# Patient Record
Sex: Male | Born: 1985 | Race: White | Hispanic: No | Smoking: Current every day smoker
Health system: Southern US, Community
[De-identification: ages and names within clinical notes are randomized; demographics above are authoritative.]

## PROBLEM LIST (undated history)

## (undated) DIAGNOSIS — F319 Bipolar disorder, unspecified: Secondary | ICD-10-CM

## (undated) DIAGNOSIS — F4310 Post-traumatic stress disorder, unspecified: Secondary | ICD-10-CM

---

## 2016-04-09 ENCOUNTER — Emergency Department (HOSPITAL_BASED_OUTPATIENT_CLINIC_OR_DEPARTMENT_OTHER): Payer: Self-pay

## 2016-04-09 ENCOUNTER — Inpatient Hospital Stay (HOSPITAL_BASED_OUTPATIENT_CLINIC_OR_DEPARTMENT_OTHER)
Admission: EM | Admit: 2016-04-09 | Discharge: 2016-04-15 | DRG: 871 | Disposition: A | Discharge status: Home / Self Care | Payer: Self-pay | Attending: Internal Medicine | Admitting: Internal Medicine

## 2016-04-09 ENCOUNTER — Encounter (HOSPITAL_BASED_OUTPATIENT_CLINIC_OR_DEPARTMENT_OTHER): Payer: Self-pay | Admitting: *Deleted

## 2016-04-09 DIAGNOSIS — A400 Sepsis due to streptococcus, group A: Principal | ICD-10-CM | POA: Diagnosis present

## 2016-04-09 DIAGNOSIS — L03114 Cellulitis of left upper limb: Secondary | ICD-10-CM

## 2016-04-09 DIAGNOSIS — I82612 Acute embolism and thrombosis of superficial veins of left upper extremity: Secondary | ICD-10-CM | POA: Diagnosis present

## 2016-04-09 DIAGNOSIS — A419 Sepsis, unspecified organism: Secondary | ICD-10-CM | POA: Diagnosis present

## 2016-04-09 DIAGNOSIS — F17210 Nicotine dependence, cigarettes, uncomplicated: Secondary | ICD-10-CM | POA: Diagnosis present

## 2016-04-09 DIAGNOSIS — R9389 Abnormal findings on diagnostic imaging of other specified body structures: Secondary | ICD-10-CM

## 2016-04-09 DIAGNOSIS — I76 Septic arterial embolism: Secondary | ICD-10-CM | POA: Diagnosis present

## 2016-04-09 DIAGNOSIS — I2690 Septic pulmonary embolism without acute cor pulmonale: Secondary | ICD-10-CM

## 2016-04-09 DIAGNOSIS — I82409 Acute embolism and thrombosis of unspecified deep veins of unspecified lower extremity: Secondary | ICD-10-CM

## 2016-04-09 DIAGNOSIS — R509 Fever, unspecified: Secondary | ICD-10-CM

## 2016-04-09 DIAGNOSIS — F1123 Opioid dependence with withdrawal: Secondary | ICD-10-CM | POA: Diagnosis present

## 2016-04-09 DIAGNOSIS — R6510 Systemic inflammatory response syndrome (SIRS) of non-infectious origin without acute organ dysfunction: Secondary | ICD-10-CM

## 2016-04-09 DIAGNOSIS — F1110 Opioid abuse, uncomplicated: Secondary | ICD-10-CM

## 2016-04-09 DIAGNOSIS — E872 Acidosis: Secondary | ICD-10-CM | POA: Diagnosis present

## 2016-04-09 DIAGNOSIS — I82622 Acute embolism and thrombosis of deep veins of left upper extremity: Secondary | ICD-10-CM

## 2016-04-09 DIAGNOSIS — J154 Pneumonia due to other streptococci: Secondary | ICD-10-CM | POA: Diagnosis present

## 2016-04-09 DIAGNOSIS — E871 Hypo-osmolality and hyponatremia: Secondary | ICD-10-CM

## 2016-04-09 LAB — CBC WITH DIFFERENTIAL/PLATELET
BASOS ABS: 0 10*3/uL (ref 0.0–0.1)
Basophils Relative: 0 %
EOS PCT: 1 %
Eosinophils Absolute: 0.3 10*3/uL (ref 0.0–0.7)
HEMATOCRIT: 35.4 % — AB (ref 39.0–52.0)
HEMOGLOBIN: 12.4 g/dL — AB (ref 13.0–17.0)
Lymphocytes Relative: 6 %
Lymphs Abs: 1.5 10*3/uL (ref 0.7–4.0)
MCH: 30.2 pg (ref 26.0–34.0)
MCHC: 35 g/dL (ref 30.0–36.0)
MCV: 86.1 fL (ref 78.0–100.0)
MONO ABS: 1.8 10*3/uL — AB (ref 0.1–1.0)
MONOS PCT: 7 %
Neutro Abs: 21.8 10*3/uL — ABNORMAL HIGH (ref 1.7–7.7)
Neutrophils Relative %: 86 %
Platelets: 277 10*3/uL (ref 150–400)
RBC: 4.11 MIL/uL — AB (ref 4.22–5.81)
RDW: 14.3 % (ref 11.5–15.5)
WBC Morphology: INCREASED
WBC: 25.4 10*3/uL — AB (ref 4.0–10.5)

## 2016-04-09 LAB — RAPID HIV SCREEN (HIV 1/2 AB+AG)
HIV 1/2 ANTIBODIES: NONREACTIVE
HIV-1 P24 ANTIGEN - HIV24: NONREACTIVE

## 2016-04-09 LAB — BASIC METABOLIC PANEL
ANION GAP: 9 (ref 5–15)
BUN: 16 mg/dL (ref 6–20)
CALCIUM: 8.1 mg/dL — AB (ref 8.9–10.3)
CO2: 24 mmol/L (ref 22–32)
CREATININE: 0.78 mg/dL (ref 0.61–1.24)
Chloride: 89 mmol/L — ABNORMAL LOW (ref 101–111)
Glucose, Bld: 143 mg/dL — ABNORMAL HIGH (ref 65–99)
Potassium: 3.5 mmol/L (ref 3.5–5.1)
SODIUM: 122 mmol/L — AB (ref 135–145)

## 2016-04-09 LAB — I-STAT CG4 LACTIC ACID, ED: Lactic Acid, Venous: 2.27 mmol/L (ref 0.5–1.9)

## 2016-04-09 MED ORDER — IOPAMIDOL (ISOVUE-300) INJECTION 61%
100.0000 mL | Freq: Once | INTRAVENOUS | Status: AC | PRN
  Administered 2016-04-10: 100 mL via INTRAVENOUS

## 2016-04-09 MED ORDER — ACETAMINOPHEN 500 MG PO TABS
1000.0000 mg | ORAL_TABLET | Freq: Once | ORAL | Status: AC
  Administered 2016-04-09: 1000 mg via ORAL
  Filled 2016-04-09: qty 2

## 2016-04-09 MED ORDER — SODIUM CHLORIDE 0.9 % IV BOLUS (SEPSIS)
30.0000 mL/kg | Freq: Once | INTRAVENOUS | Status: AC
  Administered 2016-04-09: 2040 mL via INTRAVENOUS

## 2016-04-09 MED ORDER — PIPERACILLIN-TAZOBACTAM 3.375 G IVPB
INTRAVENOUS | Status: AC
  Filled 2016-04-09: qty 50

## 2016-04-09 MED ORDER — PIPERACILLIN-TAZOBACTAM 3.375 G IVPB 30 MIN
3.3750 g | Freq: Once | INTRAVENOUS | Status: AC
  Administered 2016-04-09: 3.375 g via INTRAVENOUS
  Filled 2016-04-09: qty 50

## 2016-04-09 MED ORDER — VANCOMYCIN HCL IN DEXTROSE 1-5 GM/200ML-% IV SOLN
1000.0000 mg | Freq: Once | INTRAVENOUS | Status: AC
  Administered 2016-04-09: 1000 mg via INTRAVENOUS

## 2016-04-09 MED ORDER — VANCOMYCIN HCL IN DEXTROSE 1-5 GM/200ML-% IV SOLN
INTRAVENOUS | Status: AC
  Filled 2016-04-09: qty 200

## 2016-04-09 NOTE — ED Triage Notes (Addendum)
Pt c/o left arm pain and swelling with multiple abscess x 3 days Pt admits to iv drug use , during triage pt admitted to having needles , pt placed needles in sharp box in triage , pt denies any drugs with him

## 2016-04-09 NOTE — ED Notes (Addendum)
3 unsuccessful IV attempts for 2nd IV

## 2016-04-09 NOTE — ED Provider Notes (Signed)
[facility] Provider Note: Douglas Dell, MD, FACEP  By signing my name below, I, Douglas Parrish, attest that this documentation has been prepared under the direction and in the presence of Douglas Libra, MD. Electronically Signed: Doreatha Parrish, ED Scribe. 04/09/16. 10:59 PM.    CSN: [id number] MRN: [id number] ARRIVAL: 04/09/16 at 2149 ROOM: [facility]12/[facility]12   CHIEF COMPLAINT  Abscess   HISTORY OF PRESENT ILLNESS  Douglas Parrish is a 31 y.o. 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 Pt reports associated numbness of the left fingers. Pt also reports soreness with wrist movement and with touch. He was not aware of fevers at home, but had a temp of 103.2 in triage. No alleviating factors noted. 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 No h/o similar symptoms. He denies nausea, vomiting, diarrhea, trouble breathing, chills or additional similar areas elsewhere on the body.    History reviewed. No pertinent past medical history.  History reviewed. No pertinent surgical history.  History reviewed. No pertinent family history.  Social History  Substance Use Topics  . Smoking status: Current Every Day Smoker    Packs/day: 0.50    Types: Cigarettes  . Smokeless tobacco: Not on file  . Alcohol use No    Prior to Admission medications   Not on File    Allergies Patient has no known allergies.   REVIEW OF SYSTEMS  Negative except as noted here or in the History of Present Illness.   PHYSICAL EXAMINATION  Initial Vital Signs Blood pressure 121/71, pulse (!) 142, temperature (!) 103.2 F (39.6 C), resp. rate 16, height 5\' 8"  (1.727 m), weight 150 lb (68 kg), SpO2 96 %.  Examination General: Well-developed, well-nourished male in no acute distress; appearance consistent with age of record HENT: normocephalic; atraumatic Eyes:  pupils equal, round and reactive to light; extraocular muscles intact Neck: supple Heart: Regular rate and rhythm; tachycardia Lungs: clear to auscultation bilaterally Abdomen: soft; nondistended; nontender; no masses or hepatosplenomegaly; bowel sounds present Extremities: No deformity; full range of motion; pulses normal Neurologic: Awake, alert and oriented; motor function intact in all extremities and symmetric; no facial droop; decreased sensation of the fingers of the left hand  Skin: Warm and dry; erythema, edema, warmth and tenderness of left forearm, hand and upper arm without focal fluctuance (see photos)  Psychiatric: Normal mood and affect        RESULTS  Summary of this visit's results, reviewed by myself:   EKG Interpretation  Date/Time:  Thursday April 09 2016 22:50:04 EST Ventricular Rate:  135 PR Interval:    QRS Duration: 91 QT Interval:  278 QTC Calculation: 417 R Axis:   79 Text Interpretation:  Sinus tachycardia Probable left atrial enlargement ST elev, probable normal early repol pattern No previous ECGs available Confirmed by Cecile Guevara  MD, Douglas Parrish ([id number]) on 04/09/2016 11:06:24 PM      Laboratory Studies: Results for orders placed or performed during the hospital encounter of 04/09/16 (from the past 24 hour(s))  CBC with Differential/Platelet     Status: Abnormal   Collection Time: 04/09/16 10:20 PM  Result Value Ref Range   WBC 25.4 (H) 4.0 - 10.5 K/uL   RBC 4.11 (L) 4.22 - 5.81 MIL/uL   Hemoglobin 12.4 (L) 13.0 - 17.0 g/dL   HCT 62.1 (L) 30.8 - 65.7 %   MCV 86.1  78.0 - 100.0 fL   MCH 30.2 26.0 - 34.0 pg   MCHC 35.0 30.0 - 36.0 g/dL   RDW 16.1 09.6 - 04.5 %   Platelets 277 150 - 400 K/uL   Neutrophils Relative % 86 %   Lymphocytes Relative 6 %   Monocytes Relative 7 %   Eosinophils Relative 1 %   Basophils Relative 0 %   Neutro Abs 21.8 (H) 1.7 - 7.7 K/uL   Lymphs Abs 1.5 0.7 - 4.0 K/uL   Monocytes Absolute 1.8 (H) 0.1 - 1.0 K/uL   Eosinophils  Absolute 0.3 0.0 - 0.7 K/uL   Basophils Absolute 0.0 0.0 - 0.1 K/uL   RBC Morphology POLYCHROMASIA PRESENT    WBC Morphology INCREASED BANDS (>20% BANDS)   Basic metabolic panel     Status: Abnormal   Collection Time: 04/09/16 10:20 PM  Result Value Ref Range   Sodium 122 (L) 135 - 145 mmol/L   Potassium 3.5 3.5 - 5.1 mmol/L   Chloride 89 (L) 101 - 111 mmol/L   CO2 24 22 - 32 mmol/L   Glucose, Bld 143 (H) 65 - 99 mg/dL   BUN 16 6 - 20 mg/dL   Creatinine, Ser 4.09 0.61 - 1.24 mg/dL   Calcium 8.1 (L) 8.9 - 10.3 mg/dL   GFR calc non Af Amer >60 >60 mL/min   GFR calc Af Amer >60 >60 mL/min   Anion gap 9 5 - 15  Rapid HIV screen (HIV 1/2 Ab+Ag)     Status: None   Collection Time: 04/09/16 10:20 PM  Result Value Ref Range   HIV-1 P24 Antigen - HIV24 NON REACTIVE NON REACTIVE   HIV 1/2 Antibodies NON REACTIVE NON REACTIVE   Interpretation (HIV Ag Ab)      A non reactive test result means that HIV 1 or HIV 2 antibodies and HIV 1 p24 antigen were not detected in the specimen.  Comprehensive metabolic panel     Status: Abnormal   Collection Time: 04/09/16 10:20 PM  Result Value Ref Range   Sodium 122 (L) 135 - 145 mmol/L   Potassium 3.6 3.5 - 5.1 mmol/L   Chloride 90 (L) 101 - 111 mmol/L   CO2 23 22 - 32 mmol/L   Glucose, Bld 143 (H) 65 - 99 mg/dL   BUN 16 6 - 20 mg/dL   Creatinine, Ser 8.11 0.61 - 1.24 mg/dL   Calcium 8.2 (L) 8.9 - 10.3 mg/dL   Total Protein 6.5 6.5 - 8.1 g/dL   Albumin 2.7 (L) 3.5 - 5.0 g/dL   AST 70 (H) 15 - 41 U/L   ALT 40 17 - 63 U/L   Alkaline Phosphatase 73 38 - 126 U/L   Total Bilirubin 0.9 0.3 - 1.2 mg/dL   GFR calc non Af Amer >60 >60 mL/min   GFR calc Af Amer >60 >60 mL/min   Anion gap 9 5 - 15  I-Stat CG4 Lactic Acid, ED     Status: Abnormal   Collection Time: 04/09/16 10:37 PM  Result Value Ref Range   Lactic Acid, Venous 2.27 (HH) 0.5 - 1.9 mmol/L   Comment NOTIFIED PHYSICIAN   Urinalysis, Routine w reflex microscopic     Status: Abnormal    Collection Time: 04/10/16 12:01 AM  Result Value Ref Range   Color, Urine AMBER (A) YELLOW   APPearance CLOUDY (A) CLEAR   Specific Gravity, Urine 1.016 1.005 - 1.030   pH 6.0 5.0 - 8.0   Glucose,  UA NEGATIVE NEGATIVE mg/dL   Hgb urine dipstick LARGE (A) NEGATIVE   Bilirubin Urine NEGATIVE NEGATIVE   Ketones, ur NEGATIVE NEGATIVE mg/dL   Protein, ur 161 (A) NEGATIVE mg/dL   Nitrite NEGATIVE NEGATIVE   Leukocytes, UA NEGATIVE NEGATIVE  Urinalysis, Microscopic (reflex)     Status: Abnormal   Collection Time: 04/10/16 12:01 AM  Result Value Ref Range   RBC / HPF 0-5 0 - 5 RBC/hpf   WBC, UA 0-5 0 - 5 WBC/hpf   Bacteria, UA FEW (A) NONE SEEN   Squamous Epithelial / LPF 0-5 (A) NONE SEEN   Granular Casts, UA PRESENT   I-Stat CG4 Lactic Acid, ED  (not at  [facility])     Status: None   Collection Time: 04/10/16  2:06 AM  Result Value Ref Range   Lactic Acid, Venous 1.07 0.5 - 1.9 mmol/L   Imaging Studies: Dg Chest 2 View  Result Date: 04/10/2016 CLINICAL DATA:  31 y/o  M; chest complaints and leukocytosis. EXAM: CHEST  2 VIEW COMPARISON:  None. FINDINGS: Normal heart size. Prominent pulmonary markings, likely bronchitic changes. Opacity within the posterior costal diaphragmatic sulcus and in the retrosternal space on the lateral view. No pleural effusion or pneumothorax. Bones are unremarkable. IMPRESSION: Prominent pulmonary markings, likely bronchitic changes. Opacities within the retrosternal space and posterior costodiaphragmatic angle on the lateral view are suspicious for areas of pneumonia. The retrosternal focus is centrally lucent and may represent a pulmonary abscess/septic embolus. These results were called by telephone at the time of interpretation on 04/10/2016 at 2:24 am to Dr. Paula Parrish , who verbally acknowledged these results. Electronically Signed   By: Mitzi Hansen M.D.   On: 04/10/2016 02:21   Ct Extrem Up Entire Arm L W/cm  Result Date: 04/10/2016 CLINICAL DATA:   Left upper extremity cellulitis. IV drug use with redness, pain and swelling for several days. Elevated white blood cell count. Question abscess. EXAM: CT OF THE UPPER LEFT EXTREMITY WITH CONTRAST TECHNIQUE: Multidetector CT imaging of the upper left extremity was performed according to the standard protocol following intravenous contrast administration. COMPARISON:  None. CONTRAST:  ISOVUE-300 IOPAMIDOL (ISOVUE-300) INJECTION 61% FINDINGS: Bones/Joint/Cartilage No fracture, periosteal reaction or bony destructive change. No evidence of elbow or shoulder joint effusion. There is a bone island in the scapula. Ligaments Suboptimally assessed by CT. Muscles and Tendons No evidence of intramuscular fluid collection. Edema suspected in the lateral biceps musculature. Soft tissues Diffuse subcutaneous edema and skin thickening of the left upper extremity from the mid humerus distally. This appears most prominent about the distal humerus, elbow and forearm. Tiny skin defect about the lateral aspect of the mid forearm. No tracking soft tissue air. No peripherally enhancing collection to suggest abscess. Enlarged epitrochlear and axillary nodes are likely reactive. There is occlusive thrombosis throughout the basilic vein in the forearm and upper arm. Thrombus is also suspected of the cephalic vein in the forearm. Incidentally imaged paranasal sinus inflammation in the maxillary sinuses. IMPRESSION: 1. Diffuse cellulitis of the upper extremity without evidence of focal abscess. Tiny skin defect about the mid forearm, no tracking soft tissue air. 2. Edema within biceps musculature without gross intramuscular collection. 3. Thrombosis of the basilic and cephalic veins. 4. Epitrochlear and axillary lymph nodes are likely reactive. Electronically Signed   By: Rubye Oaks M.D.   On: 04/10/2016 01:59    ED COURSE  Nursing notes and initial vitals signs, including pulse oximetry, reviewed.  Vitals:   04/10/16  0030  04/10/16 0100 04/10/16 0224 04/10/16 0230  BP: 104/76 99/76 115/70 113/82  Pulse: 115 116 119 (!) 125  Resp: 19 17 (!) 28 26  Temp:   102.3 F (39.1 C)   TempSrc:   Oral   SpO2: 99% 95% 99% 96%  Weight:      Height:       Sepsis protocol initiated on initial evaluation. Zosyn and vancomycin given IV after cultures obtained.  2:29 AM Sepsis - Repeat Assessment  Vitals     Blood pressure 115/70, pulse 119, temperature 102.3 F (39.1 C), temperature source Oral, resp. rate (!) 28, height 5\' 8"  (1.727 m), weight 150 lb (68 kg), SpO2 99 %.  Heart:     Regular rate and rhythm and Tachycardic  Lungs:    CTA  Capillary Refill:   <2 sec  Peripheral Pulse:   Radial, dorsalis pedis and posterior tibial pulses palpable  Skin:     Normal Color     PROCEDURES   CRITICAL CARE Performed by: Taralee Marcus L Total critical care time: 45 minutes Critical care time was exclusive of separately billable procedures and treating other patients. Critical care was necessary to treat or prevent imminent or life-threatening deterioration. Critical care was time spent personally by me on the following activities: development of treatment plan with patient and/or surrogate as well as nursing, discussions with consultants, evaluation of patient's response to treatment, examination of patient, obtaining history from patient or surrogate, ordering and performing treatments and interventions, ordering and review of laboratory studies, ordering and review of radiographic studies, pulse oximetry and re-evaluation of patient's condition.   ED DIAGNOSES     ICD-9-CM ICD-10-CM   1. SIRS (systemic inflammatory response syndrome) (HCC) 995.90 R65.10   2. Cellulitis of left upper extremity 682.3 L03.114 CT Extrem Up Entire Arm L W/CM     CT Extrem Up Entire Arm L W/CM  3. Heroin abuse 305.50 F11.10   4. Acute deep vein thrombosis (DVT) of other vein of left upper extremity (HCC) 453.82 I82.622   5. Acute  septic pulmonary embolism without acute cor pulmonale (HCC) 415.12 I26.90   6. Hyponatremia 276.1 E87.1      I personally performed the services described in this documentation, which was scribed in my presence. The recorded information has been reviewed and is accurate.    Douglas LibraJohn Sincerity Cedar, MD 04/10/16 [id number]

## 2016-04-10 ENCOUNTER — Encounter (HOSPITAL_COMMUNITY): Payer: Self-pay | Admitting: Internal Medicine

## 2016-04-10 ENCOUNTER — Inpatient Hospital Stay (HOSPITAL_COMMUNITY): Payer: Self-pay

## 2016-04-10 ENCOUNTER — Emergency Department (HOSPITAL_BASED_OUTPATIENT_CLINIC_OR_DEPARTMENT_OTHER): Payer: Self-pay

## 2016-04-10 DIAGNOSIS — F1110 Opioid abuse, uncomplicated: Secondary | ICD-10-CM

## 2016-04-10 DIAGNOSIS — E871 Hypo-osmolality and hyponatremia: Secondary | ICD-10-CM

## 2016-04-10 DIAGNOSIS — A419 Sepsis, unspecified organism: Secondary | ICD-10-CM

## 2016-04-10 DIAGNOSIS — R9389 Abnormal findings on diagnostic imaging of other specified body structures: Secondary | ICD-10-CM

## 2016-04-10 DIAGNOSIS — L03114 Cellulitis of left upper limb: Secondary | ICD-10-CM

## 2016-04-10 DIAGNOSIS — R938 Abnormal findings on diagnostic imaging of other specified body structures: Secondary | ICD-10-CM

## 2016-04-10 DIAGNOSIS — I82622 Acute embolism and thrombosis of deep veins of left upper extremity: Secondary | ICD-10-CM

## 2016-04-10 DIAGNOSIS — R7881 Bacteremia: Secondary | ICD-10-CM

## 2016-04-10 DIAGNOSIS — M7989 Other specified soft tissue disorders: Secondary | ICD-10-CM

## 2016-04-10 DIAGNOSIS — I2690 Septic pulmonary embolism without acute cor pulmonale: Secondary | ICD-10-CM

## 2016-04-10 DIAGNOSIS — M79609 Pain in unspecified limb: Secondary | ICD-10-CM

## 2016-04-10 DIAGNOSIS — I82409 Acute embolism and thrombosis of unspecified deep veins of unspecified lower extremity: Secondary | ICD-10-CM

## 2016-04-10 LAB — PROTIME-INR
INR: 1.24
PROTHROMBIN TIME: 15.7 s — AB (ref 11.4–15.2)

## 2016-04-10 LAB — URINALYSIS, ROUTINE W REFLEX MICROSCOPIC
Bilirubin Urine: NEGATIVE
GLUCOSE, UA: NEGATIVE mg/dL
Ketones, ur: NEGATIVE mg/dL
LEUKOCYTES UA: NEGATIVE
NITRITE: NEGATIVE
PROTEIN: 100 mg/dL — AB
Specific Gravity, Urine: 1.016 (ref 1.005–1.030)
pH: 6 (ref 5.0–8.0)

## 2016-04-10 LAB — COMPREHENSIVE METABOLIC PANEL
ALBUMIN: 2.2 g/dL — AB (ref 3.5–5.0)
ALT: 40 U/L (ref 17–63)
ALT: 32 U/L (ref 17–63)
ANION GAP: 9 (ref 5–15)
ANION GAP: 7 (ref 5–15)
AST: 70 U/L — ABNORMAL HIGH (ref 15–41)
AST: 47 U/L — ABNORMAL HIGH (ref 15–41)
Albumin: 2.7 g/dL — ABNORMAL LOW (ref 3.5–5.0)
Alkaline Phosphatase: 73 U/L (ref 38–126)
Alkaline Phosphatase: 91 U/L (ref 38–126)
BUN: 16 mg/dL (ref 6–20)
BUN: 10 mg/dL (ref 6–20)
CHLORIDE: 90 mmol/L — AB (ref 101–111)
CHLORIDE: 101 mmol/L (ref 101–111)
CO2: 23 mmol/L (ref 22–32)
CO2: 29 mmol/L (ref 22–32)
CREATININE: 0.84 mg/dL (ref 0.61–1.24)
Calcium: 8.2 mg/dL — ABNORMAL LOW (ref 8.9–10.3)
Calcium: 7 mg/dL — ABNORMAL LOW (ref 8.9–10.3)
Creatinine, Ser: 0.7 mg/dL (ref 0.61–1.24)
GFR calc non Af Amer: >60 mL/min (ref >60)
GLUCOSE: 119 mg/dL — AB (ref 65–99)
Glucose, Bld: 143 mg/dL — ABNORMAL HIGH (ref 65–99)
Potassium: 3.6 mmol/L (ref 3.5–5.1)
Potassium: 3.5 mmol/L (ref 3.5–5.1)
SODIUM: 122 mmol/L — AB (ref 135–145)
SODIUM: 137 mmol/L (ref 135–145)
Total Bilirubin: 0.9 mg/dL (ref 0.3–1.2)
Total Bilirubin: 1.1 mg/dL (ref 0.3–1.2)
Total Protein: 6.5 g/dL (ref 6.5–8.1)
Total Protein: 7.3 g/dL (ref 6.5–8.1)

## 2016-04-10 LAB — CBC WITH DIFFERENTIAL/PLATELET
BASOS PCT: 0 %
Basophils Absolute: 0 10*3/uL (ref 0.0–0.1)
EOS ABS: 0 10*3/uL (ref 0.0–0.7)
EOS PCT: 0 %
HCT: 32.8 % — ABNORMAL LOW (ref 39.0–52.0)
Hemoglobin: 11 g/dL — ABNORMAL LOW (ref 13.0–17.0)
LYMPHS ABS: 1.3 10*3/uL (ref 0.7–4.0)
Lymphocytes Relative: 7 %
MCH: 29.1 pg (ref 26.0–34.0)
MCHC: 33.5 g/dL (ref 30.0–36.0)
MCV: 86.8 fL (ref 78.0–100.0)
Monocytes Absolute: 1.5 10*3/uL — ABNORMAL HIGH (ref 0.1–1.0)
Monocytes Relative: 8 %
NEUTROS PCT: 85 %
Neutro Abs: 16.3 10*3/uL — ABNORMAL HIGH (ref 1.7–7.7)
PLATELETS: 215 10*3/uL (ref 150–400)
RBC: 3.78 MIL/uL — AB (ref 4.22–5.81)
RDW: 15.1 % (ref 11.5–15.5)
WBC: 19.2 10*3/uL — AB (ref 4.0–10.5)

## 2016-04-10 LAB — BLOOD CULTURE ID PANEL (REFLEXED)
Acinetobacter baumannii: NOT DETECTED
CANDIDA GLABRATA: NOT DETECTED
CANDIDA KRUSEI: NOT DETECTED
Candida albicans: NOT DETECTED
Candida parapsilosis: NOT DETECTED
Candida tropicalis: NOT DETECTED
ENTEROBACTER CLOACAE COMPLEX: NOT DETECTED
ENTEROCOCCUS SPECIES: NOT DETECTED
ESCHERICHIA COLI: NOT DETECTED
Enterobacteriaceae species: NOT DETECTED
Haemophilus influenzae: NOT DETECTED
Klebsiella oxytoca: NOT DETECTED
Klebsiella pneumoniae: NOT DETECTED
LISTERIA MONOCYTOGENES: NOT DETECTED
NEISSERIA MENINGITIDIS: NOT DETECTED
PROTEUS SPECIES: NOT DETECTED
PSEUDOMONAS AERUGINOSA: NOT DETECTED
SERRATIA MARCESCENS: NOT DETECTED
STAPHYLOCOCCUS AUREUS BCID: NOT DETECTED
STAPHYLOCOCCUS SPECIES: NOT DETECTED
STREPTOCOCCUS AGALACTIAE: NOT DETECTED
STREPTOCOCCUS PNEUMONIAE: NOT DETECTED
STREPTOCOCCUS PYOGENES: DETECTED — AB
Streptococcus species: DETECTED — AB

## 2016-04-10 LAB — BASIC METABOLIC PANEL
Anion gap: 8 (ref 5–15)
BUN: 12 mg/dL (ref 6–20)
CO2: 23 mmol/L (ref 22–32)
CREATININE: 0.73 mg/dL (ref 0.61–1.24)
Calcium: 7.4 mg/dL — ABNORMAL LOW (ref 8.9–10.3)
Chloride: 98 mmol/L — ABNORMAL LOW (ref 101–111)
GFR calc Af Amer: >60 mL/min (ref >60)
GFR calc non Af Amer: >60 mL/min (ref >60)
GLUCOSE: 122 mg/dL — AB (ref 65–99)
POTASSIUM: 3.3 mmol/L — AB (ref 3.5–5.1)
SODIUM: 129 mmol/L — AB (ref 135–145)

## 2016-04-10 LAB — URINALYSIS, MICROSCOPIC (REFLEX)

## 2016-04-10 LAB — LACTIC ACID, PLASMA
LACTIC ACID, VENOUS: 1 mmol/L (ref 0.5–1.9)
Lactic Acid, Venous: 1.6 mmol/L (ref 0.5–1.9)

## 2016-04-10 LAB — I-STAT CG4 LACTIC ACID, ED: LACTIC ACID, VENOUS: 1.07 mmol/L (ref 0.5–1.9)

## 2016-04-10 LAB — ECHOCARDIOGRAM COMPLETE
Height: 69 in
WEIGHTICAEL: 2490.32 [oz_av]

## 2016-04-10 LAB — APTT: APTT: 40 s — AB (ref 24–36)

## 2016-04-10 LAB — PROCALCITONIN: PROCALCITONIN: 3.99 ng/mL

## 2016-04-10 LAB — LIPASE, BLOOD: LIPASE: 15 U/L (ref 11–51)

## 2016-04-10 LAB — MRSA PCR SCREENING: MRSA BY PCR: NEGATIVE

## 2016-04-10 MED ORDER — ONDANSETRON HCL 4 MG/2ML IJ SOLN: 4.0000 mg | Freq: Three times a day (TID) | INTRAMUSCULAR | Status: DC | PRN

## 2016-04-10 MED ORDER — IBUPROFEN 800 MG PO TABS
800.0000 mg | ORAL_TABLET | Freq: Once | ORAL | Status: AC
  Administered 2016-04-10: 800 mg via ORAL
  Filled 2016-04-10: qty 1

## 2016-04-10 MED ORDER — IOPAMIDOL (ISOVUE-300) INJECTION 61%
INTRAVENOUS | Status: AC
  Administered 2016-04-10: 75 mL via INTRAVENOUS
  Filled 2016-04-10: qty 75

## 2016-04-10 MED ORDER — PIPERACILLIN-TAZOBACTAM 3.375 G IVPB
3.3750 g | Freq: Three times a day (TID) | INTRAVENOUS | Status: DC
  Administered 2016-04-10 (×2): 3.375 g via INTRAVENOUS
  Filled 2016-04-10 (×5): qty 50

## 2016-04-10 MED ORDER — CLINDAMYCIN PHOSPHATE 600 MG/50ML IV SOLN
600.0000 mg | Freq: Three times a day (TID) | INTRAVENOUS | Status: DC
  Administered 2016-04-10 – 2016-04-13 (×8): 600 mg via INTRAVENOUS
  Filled 2016-04-10 (×9): qty 50

## 2016-04-10 MED ORDER — SODIUM CHLORIDE 0.9 % IV SOLN
INTRAVENOUS | Status: DC
  Administered 2016-04-10 – 2016-04-14 (×4): via INTRAVENOUS

## 2016-04-10 MED ORDER — ONDANSETRON HCL 4 MG PO TABS: 4.0000 mg | ORAL_TABLET | Freq: Four times a day (QID) | ORAL | Status: DC | PRN

## 2016-04-10 MED ORDER — ENOXAPARIN SODIUM 80 MG/0.8ML ~~LOC~~ SOLN
1.0000 mg/kg | Freq: Once | SUBCUTANEOUS | Status: AC
  Administered 2016-04-10: 70 mg via SUBCUTANEOUS
  Filled 2016-04-10: qty 0.8

## 2016-04-10 MED ORDER — MORPHINE SULFATE (PF) 2 MG/ML IV SOLN: 2.0000 mg | INTRAVENOUS | Status: DC | PRN

## 2016-04-10 MED ORDER — SODIUM CHLORIDE 0.9 % IV SOLN
INTRAVENOUS | Status: AC
  Administered 2016-04-10: 03:00:00 via INTRAVENOUS

## 2016-04-10 MED ORDER — ENOXAPARIN SODIUM 80 MG/0.8ML ~~LOC~~ SOLN
70.0000 mg | Freq: Two times a day (BID) | SUBCUTANEOUS | Status: DC
  Administered 2016-04-10: 70 mg via SUBCUTANEOUS
  Filled 2016-04-10: qty 0.8

## 2016-04-10 MED ORDER — PIPERACILLIN-TAZOBACTAM 3.375 G IVPB 30 MIN: 3.3750 g | Freq: Once | INTRAVENOUS | Status: DC

## 2016-04-10 MED ORDER — VANCOMYCIN HCL IN DEXTROSE 1-5 GM/200ML-% IV SOLN: 1000.0000 mg | Freq: Once | INTRAVENOUS | Status: DC

## 2016-04-10 MED ORDER — VANCOMYCIN HCL 10 G IV SOLR
1250.0000 mg | Freq: Two times a day (BID) | INTRAVENOUS | Status: DC
Start: 2016-04-10 — End: 2016-04-10
  Administered 2016-04-10: 1250 mg via INTRAVENOUS
  Filled 2016-04-10 (×3): qty 1250

## 2016-04-10 MED ORDER — SODIUM CHLORIDE 0.9 % IV BOLUS (SEPSIS)
1000.0000 mL | Freq: Once | INTRAVENOUS | Status: AC
  Administered 2016-04-10: 1000 mL via INTRAVENOUS

## 2016-04-10 MED ORDER — ACETAMINOPHEN 325 MG PO TABS
650.0000 mg | ORAL_TABLET | Freq: Four times a day (QID) | ORAL | Status: DC | PRN
  Administered 2016-04-10: 650 mg via ORAL
  Filled 2016-04-10: qty 2

## 2016-04-10 MED ORDER — PENICILLIN G POTASSIUM 5000000 UNITS IJ SOLR
4.0000 10*6.[IU] | INTRAVENOUS | Status: DC
  Administered 2016-04-10 – 2016-04-15 (×26): 4 10*6.[IU] via INTRAVENOUS
  Filled 2016-04-10 (×32): qty 4

## 2016-04-10 MED ORDER — ENOXAPARIN SODIUM 40 MG/0.4ML ~~LOC~~ SOLN
40.0000 mg | SUBCUTANEOUS | Status: DC
  Administered 2016-04-11 – 2016-04-15 (×4): 40 mg via SUBCUTANEOUS
  Filled 2016-04-10 (×5): qty 0.4

## 2016-04-10 MED ORDER — ACETAMINOPHEN 650 MG RE SUPP: 650.0000 mg | Freq: Four times a day (QID) | RECTAL | Status: DC | PRN

## 2016-04-10 MED ORDER — ONDANSETRON HCL 4 MG/2ML IJ SOLN: 4.0000 mg | Freq: Four times a day (QID) | INTRAMUSCULAR | Status: DC | PRN

## 2016-04-10 NOTE — Progress Notes (Signed)
Pharmacy Antibiotic Note  Douglas Parrish is a 31 y.o. male admitted on 04/09/2016 with cellulitis/septic emboli.  Pharmacy has been consulted for Vancomycin and Zosyn  Dosing.  Vancomycin 1 g IV given in ED at 2330   Plan: Vancomycin 1250 mg IV q12h Zosyn 3.375 g IV q8h   Height: 5\' 9"  (175.3 cm) Weight: 155 lb 10.3 oz (70.6 kg) IBW/kg (Calculated) : 70.7  Temp (24hrs), Avg:100.7 F (38.2 C), Min:98.1 F (36.7 C), Max:103.2 F (39.6 C)   Recent Labs Lab 04/09/16 2220 04/09/16 2237 04/10/16 0206 04/10/16 0310  WBC 25.4*  --   --   --   CREATININE 0.84  0.78  --   --  0.73  LATICACIDVEN  --  2.27* 1.07  --     Estimated Creatinine Clearance: 137.3 mL/min (by C-G formula based on SCr of 0.73 mg/dL).    No Known Allergies   Douglas Parrish, Douglas Parrish

## 2016-04-10 NOTE — Progress Notes (Signed)
PHARMACY - PHYSICIAN COMMUNICATION CRITICAL VALUE ALERT - BLOOD CULTURE IDENTIFICATION (BCID)  Results for orders placed or performed during the hospital encounter of 04/09/16  Blood Culture ID Panel (Reflexed) (Collected: 04/09/2016 10:20 PM)  Result Value Ref Range   Enterococcus species NOT DETECTED NOT DETECTED   Listeria monocytogenes NOT DETECTED NOT DETECTED   Staphylococcus species NOT DETECTED NOT DETECTED   Staphylococcus aureus NOT DETECTED NOT DETECTED   Streptococcus species DETECTED (A) NOT DETECTED   Streptococcus agalactiae NOT DETECTED NOT DETECTED   Streptococcus pneumoniae NOT DETECTED NOT DETECTED   Streptococcus pyogenes DETECTED (A) NOT DETECTED   Acinetobacter baumannii NOT DETECTED NOT DETECTED   Enterobacteriaceae species NOT DETECTED NOT DETECTED   Enterobacter cloacae complex NOT DETECTED NOT DETECTED   Escherichia coli NOT DETECTED NOT DETECTED   Klebsiella oxytoca NOT DETECTED NOT DETECTED   Klebsiella pneumoniae NOT DETECTED NOT DETECTED   Proteus species NOT DETECTED NOT DETECTED   Serratia marcescens NOT DETECTED NOT DETECTED   Haemophilus influenzae NOT DETECTED NOT DETECTED   Neisseria meningitidis NOT DETECTED NOT DETECTED   Pseudomonas aeruginosa NOT DETECTED NOT DETECTED   Candida albicans NOT DETECTED NOT DETECTED   Candida glabrata NOT DETECTED NOT DETECTED   Candida krusei NOT DETECTED NOT DETECTED   Candida parapsilosis NOT DETECTED NOT DETECTED   Candida tropicalis NOT DETECTED NOT DETECTED    Name of physician (or Provider) Contacted: M. Lynch, NP Changes to prescribed antibiotics required:  Discontinue Vancomycin and Zosyn. Start Penicillin G 4 MU IV q4h and Clindamycin 600 mg IV q8h.   Thank you for allowing pharmacy to be part of this patients care team. Douglas Parrish, RPh Clinical Pharmacist Pager: [phone] 04/10/2016  8:52 PM

## 2016-04-10 NOTE — H&P (Signed)
History and Physical    Douglas Parrish ZOX:[id number] DOB: 03-10-1986 DOA: 04/09/2016  PCP: No PCP Per Patient  Patient coming from: Home.  Chief Complaint: Left forearm swelling fever and chills.  HPI: Douglas Parrish is a 31 y.o. male with history of IV drug abuse presents to the ER because of worsening pain on the left forearm over the last 2-3 days. Patient states he had injected drugs. Patient also noticed a small blister which gave way on his left forearm with bloody discharge. Patient has been having subjective feeling of fever chills and diaphoresis. Denies any chest pain shortness of breath productive cough nausea vomiting or diarrhea. In the ER CT scan of the left upper extremity shows diffuse cellulitis with DVT. Patient was started on empiric antibiotics after blood cultures were obtained. Lovenox for DVT. Chest x-ray also shows possible pneumonia and septic emboli.   ED Course: CT scan shows diffuse enlargement of the left upper extremity and DVT. Empiric antibiotics started for sepsis from cellulitis. Lovenox for DVT.  Review of Systems: As per HPI, rest all negative.   History reviewed. No pertinent past medical history.  History reviewed. No pertinent surgical history.   reports that he has been smoking Cigarettes.  He has been smoking about 0.50 packs per day. He has never used smokeless tobacco. He reports that he uses drugs, including IV. He reports that he does not drink alcohol.  No Known Allergies  Family History  Problem Relation Age of Onset  . CAD Neg Hx   . Diabetes Mellitus II Neg Hx     Prior to Admission medications   Not on File    Physical Exam: Vitals:   04/10/16 0331 04/10/16 0351 04/10/16 0445 04/10/16 0534  BP:  104/55 94/61   Pulse:  (!) 122 (!) 107   Resp:  22 18   Temp: 101.1 F (38.4 C) 99.9 F (37.7 C) 98.1 F (36.7 C)   TempSrc: Oral Oral Oral   SpO2:  97% 96%   Weight:    70.6 kg (155 lb 10.3 oz)  Height:    5\' 9"  (1.753 m)       Constitutional: Moderately built and nourished. Vitals:   04/10/16 0331 04/10/16 0351 04/10/16 0445 04/10/16 0534  BP:  104/55 94/61   Pulse:  (!) 122 (!) 107   Resp:  22 18   Temp: 101.1 F (38.4 C) 99.9 F (37.7 C) 98.1 F (36.7 C)   TempSrc: Oral Oral Oral   SpO2:  97% 96%   Weight:    70.6 kg (155 lb 10.3 oz)  Height:    5\' 9"  (1.753 m)   Eyes: Anicteric. No pallor. ENMT: No discharge from the ears eyes nose or mouth. Neck: No mass felt. No neck rigidity no JVD appreciated. Respiratory: No rhonchi or crepitations. Cardiovascular: S1-S2 heard no murmurs appreciated. Abdomen: Soft nontender bowel sounds present. No guarding or rigidity. Musculoskeletal: Left forearm is swollen up to the elbow and fingers. Patient is able to make a fist but not fully. Patient is able to move his elbow. There is a small ulceration on the lateral aspect of his forearm. Skin: Erythema and ulceration of the left forearm. Extension of the elbow up to the hands. The ulceration is around half a centimeter. Neurologic: Alert awake oriented to time place and person. Moves all extremities. Psychiatric: Appears normal. Normal affect.   Labs on Admission: I have personally reviewed following labs and imaging studies  CBC:  Recent Labs  Lab 04/09/16 2220  WBC 25.4*  NEUTROABS 21.8*  HGB 12.4*  HCT 35.4*  MCV 86.1  PLT 277   Basic Metabolic Panel:  Recent Labs Lab 04/09/16 2220 04/10/16 0310  NA 122*  122* 129*  K 3.6  3.5 3.3*  CL 90*  89* 98*  CO2 23  24 23   GLUCOSE 143*  143* 122*  BUN 16  16 12   CREATININE 0.84  0.78 0.73  CALCIUM 8.2*  8.1* 7.4*   GFR: Estimated Creatinine Clearance: 137.3 mL/min (by C-G formula based on SCr of 0.73 mg/dL). Liver Function Tests:  Recent Labs Lab 04/09/16 2220  AST 70*  ALT 40  ALKPHOS 73  BILITOT 0.9  PROT 6.5  ALBUMIN 2.7*   No results for input(s): LIPASE, AMYLASE in the last 168 hours. No results for input(s): AMMONIA  in the last 168 hours. Coagulation Profile: No results for input(s): INR, PROTIME in the last 168 hours. Cardiac Enzymes: No results for input(s): CKTOTAL, CKMB, CKMBINDEX, TROPONINI in the last 168 hours. BNP (last 3 results) No results for input(s): PROBNP in the last 8760 hours. HbA1C: No results for input(s): HGBA1C in the last 72 hours. CBG: No results for input(s): GLUCAP in the last 168 hours. Lipid Profile: No results for input(s): CHOL, HDL, LDLCALC, TRIG, CHOLHDL, LDLDIRECT in the last 72 hours. Thyroid Function Tests: No results for input(s): TSH, T4TOTAL, FREET4, T3FREE, THYROIDAB in the last 72 hours. Anemia Panel: No results for input(s): VITAMINB12, FOLATE, FERRITIN, TIBC, IRON, RETICCTPCT in the last 72 hours. Urine analysis:    Component Value Date/Time   COLORURINE AMBER (A) 04/10/2016 0001   APPEARANCEUR CLOUDY (A) 04/10/2016 0001   LABSPEC 1.016 04/10/2016 0001   PHURINE 6.0 04/10/2016 0001   GLUCOSEU NEGATIVE 04/10/2016 0001   HGBUR LARGE (A) 04/10/2016 0001   BILIRUBINUR NEGATIVE 04/10/2016 0001   KETONESUR NEGATIVE 04/10/2016 0001   PROTEINUR 100 (A) 04/10/2016 0001   NITRITE NEGATIVE 04/10/2016 0001   LEUKOCYTESUR NEGATIVE 04/10/2016 0001   Sepsis Labs: @LABRCNTIP (procalcitonin:4,lacticidven:4) )No results found for this or any previous visit (from the past 240 hour(s)).   Radiological Exams on Admission: Dg Chest 2 View  Result Date: 04/10/2016 CLINICAL DATA:  31 y/o  M; chest complaints and leukocytosis. EXAM: CHEST  2 VIEW COMPARISON:  None. FINDINGS: Normal heart size. Prominent pulmonary markings, likely bronchitic changes. Opacity within the posterior costal diaphragmatic sulcus and in the retrosternal space on the lateral view. No pleural effusion or pneumothorax. Bones are unremarkable. IMPRESSION: Prominent pulmonary markings, likely bronchitic changes. Opacities within the retrosternal space and posterior costodiaphragmatic angle on the lateral  view are suspicious for areas of pneumonia. The retrosternal focus is centrally lucent and may represent a pulmonary abscess/septic embolus. These results were called by telephone at the time of interpretation on 04/10/2016 at 2:24 am to Dr. Paula LibraJOHN MOLPUS , who verbally acknowledged these results. Electronically Signed   By: Mitzi HansenLance  Furusawa-Stratton M.D.   On: 04/10/2016 02:21   Ct Extrem Up Entire Arm L W/cm  Result Date: 04/10/2016 CLINICAL DATA:  Left upper extremity cellulitis. IV drug use with redness, pain and swelling for several days. Elevated white blood cell count. Question abscess. EXAM: CT OF THE UPPER LEFT EXTREMITY WITH CONTRAST TECHNIQUE: Multidetector CT imaging of the upper left extremity was performed according to the standard protocol following intravenous contrast administration. COMPARISON:  None. CONTRAST:  100mL ISOVUE-300 IOPAMIDOL (ISOVUE-300) INJECTION 61% FINDINGS: Bones/Joint/Cartilage No fracture, periosteal reaction or bony destructive change. No evidence of  elbow or shoulder joint effusion. There is a bone island in the scapula. Ligaments Suboptimally assessed by CT. Muscles and Tendons No evidence of intramuscular fluid collection. Edema suspected in the lateral biceps musculature. Soft tissues Diffuse subcutaneous edema and skin thickening of the left upper extremity from the mid humerus distally. This appears most prominent about the distal humerus, elbow and forearm. Tiny skin defect about the lateral aspect of the mid forearm. No tracking soft tissue air. No peripherally enhancing collection to suggest abscess. Enlarged epitrochlear and axillary nodes are likely reactive. There is occlusive thrombosis throughout the basilic vein in the forearm and upper arm. Thrombus is also suspected of the cephalic vein in the forearm. Incidentally imaged paranasal sinus inflammation in the maxillary sinuses. IMPRESSION: 1. Diffuse cellulitis of the upper extremity without evidence of focal  abscess. Tiny skin defect about the mid forearm, no tracking soft tissue air. 2. Edema within biceps musculature without gross intramuscular collection. 3. Thrombosis of the basilic and cephalic veins. 4. Epitrochlear and axillary lymph nodes are likely reactive. Electronically Signed   By: Rubye Oaks M.D.   On: 04/10/2016 01:59    Assessment/Plan Principal Problem:   Sepsis (HCC) Active Problems:   Hyponatremia   Heroin abuse   Cellulitis of left upper extremity   Deep venous thrombosis (HCC)   Acute septic pulmonary embolism without acute cor pulmonale (HCC)    1. Sepsis from left upper extremity cellulitis in IV drug abuser with possible septic emboli as seen on chest x-ray - blood cultures have been obtained. Patient is placed empirically on vancomycin and Zosyn. Follow lactate levels procalcitonin levels and continue hydration. I have also ordered 2-D echo. Since there is concern for septic emboli in the lung along with possible pneumonia I have ordered CT chest with contrast. Since patient has significant swelling of the left upper extremity may consult hand surgery in a.m. to have further recommendations. CT does not show any definite abscess. Patient states his last tetanus toxoid was in 2013. 2. DVT of the left upper extremity - I have placed patient on Lovenox. I have ordered Dopplers of the left upper extremity. 3. Hyponatremia - patient is receiving hydration. Recheck metabolic panel.  4. IV drug abuse and tobacco abuse - patient advised to quit these habits. Social work consulted.   DVT prophylaxis: Lovenox for DVT. Code Status: Full code.  Family Communication: Discussed with patient.  Disposition Plan: Home.  Consults called: None.  Admission status: Inpatient.    Eduard Clos MD [facility] Pager [phone].  If 7PM-7AM, please contact night-coverage [email or web address] Password [facility]  04/10/2016, 5:48 AM

## 2016-04-10 NOTE — ED Notes (Signed)
Informed pt about his results and the gravity of his diagnosis.  Advised pt to use this time to get clean and detox and cut ties with his girlfriend.  Pt verbalizes desire to get sober and clean up his life.

## 2016-04-10 NOTE — ED Notes (Signed)
Pt still in radiology.

## 2016-04-10 NOTE — Progress Notes (Signed)
ANTICOAGULATION CONSULT NOTE - Initial Consult  Pharmacy Consult for Lovenox Indication: DVT  No Known Allergies  Patient Measurements: Height: 5\' 9"  (175.3 cm) Weight: 155 lb 10.3 oz (70.6 kg) IBW/kg (Calculated) : 70.7  Vital Signs: Temp: 98.1 F (36.7 C) (01/19 0445) Temp Source: Oral (01/19 0445) BP: 94/61 (01/19 0445) Pulse Rate: 107 (01/19 0445)  Labs:  Recent Labs  04/09/16 2220 04/10/16 0310  HGB 12.4*  --   HCT 35.4*  --   PLT 277  --   CREATININE 0.84  0.78 0.73    Estimated Creatinine Clearance: 137.3 mL/min (by C-G formula based on SCr of 0.73 mg/dL).   Medical History: History reviewed. No pertinent past medical history.  Assessment: 31 y.o. male with LUE DVT for Lovenox  Goal of Therapy:  Full anticoagulation with Lovenox Monitor platelets by anticoagulation protocol: Yes   Plan:  Lovenox 70 mg SQ 12h  Analysse Quinonez, Gary FleetGregory Vernon 04/10/2016,5:54 AM

## 2016-04-10 NOTE — Progress Notes (Signed)
*  PRELIMINARY RESULTS* Vascular Ultrasound Left upper extremity venous duplex has been completed.  Preliminary findings: NEGATIVE FOR DVT OF LUE.  Superficial thrombosis noted in the left cephalic and basilic veins.   Text paged Dr. Isidoro Donningai.  Farrel DemarkJill Eunice, RDMS, RVT  04/10/2016, 3:22 PM

## 2016-04-10 NOTE — ED Notes (Signed)
Pt comes in with left arm swelling and redness since yesterday.  Pt admits to IV drug use with girlfriend who came in with patient for treatment of her swollen, red arm as well.  Pt states he started using heroin in July because he "wanted to see what the hype was about" with his girlfriend and why she liked it that much.  Pt denies any other drug abuse at all.  Pt states he used to be an [employer] and [employer] and did three tours of duty.  He has two children that are staying with his mom in [place].  He has a full time job and his own house.  Pt states he cannot believe he has gotten hooked on heroin.

## 2016-04-10 NOTE — Progress Notes (Signed)
  Echocardiogram 2D Echocardiogram has been performed.  Douglas Parrish, Douglas Parrish R 04/10/2016, 2:18 PM

## 2016-04-10 NOTE — Plan of Care (Signed)
31 year old male with history of IV drug abuse presents to the ER because of pain and swelling of the left upper extremity. CT scan shows features consistent with cellulitis with no definite abscess. Also shows clots involving the veins. Chest x-ray shows possible septic emboli. Patient is being placed on empiric antibiotics for sepsis from cellulitis. Lovenox was started for clots in the left upper extremity as seen in the CAT scan. Patient's sodium is low and will be having a repeat metabolic panel to confirm the sodium levels.  Midge MiniumArshad Kakrakandy.

## 2016-04-10 NOTE — Progress Notes (Addendum)
[facility]                                                                              Patient Demographics  Douglas Parrish, is a 31 y.o. male, DOB - 1985/06/02, ZOX:[id number]  Admit date - 04/09/2016   Admitting Physician Eduard Clos, MD  Outpatient Primary MD for the patient is No PCP Per Patient  Outpatient specialists:   LOS - 0  days    Chief Complaint  Patient presents with  . Abscess       Brief summary   Patient is a 31 year old male with history of IV drug use presented with worsening pain on the left forearm in the last 2-3 days. Patient admitted to have been injecting drugs in the same arm, subjective fevers and chills. CT scan of the left upper extremity showed a diffuse cellulitis with DVT. Patient was started on empiric antibiotics after blood cultures were obtained.   Assessment & Plan    Principal Problem:   Sepsis (HCC)From the left upper extremity cellulitis in the setting of IV drug use - Follow blood cultures - Empirically on vancomycin and Zosyn - Follow 2-D echo -  concern for septic emboli in the lung, follow CT chest with contrast - CT of the left arm showed diffuse cellulitis without any focal abscess, edema within the biceps musculature without gross intramuscular collection, thrombosis of the basilic and cephalic veins - ortho consulted, d/w Dr Roda Shutters - Keep left upper arm elevated  Active Problems:   Deep venous thrombosis (HCC) LUE -  CT of the left arm showed thrombosis of the basilic and cephalic veins - Follow Dopplers of the left arm, continue full dose Lovenox, will change to IV heparin if patient needs to go to or ADDENDUM: Doppler US neg for DVT, will change to prophylactic lovenox   Multifocal pneumonia:  - CT chest showed Multifocal pneumonia. Small areas of apparent cavitation noted in the right upper lobe and right lower lobe regions of consolidation.  - cont IV vanc and zosyn     Hyponatremia with  lactic acidosis - Likely due to #1, improving with IV fluid hydration  Polysubstance abuse, drug use - Patient counseled on drugs cessation  - He uses IV heroin, placed on IV morphine as needed for any heroin withdrawals  Code Status:full  DVT Prophylaxis:  Lovenox  Family Communication: Discussed in detail with the patient, all imaging results, lab results explained to the patient  Disposition Plan:   Time Spent in minutes   Procedures:  CT left arm  Consultants:   Orthopedics  Antimicrobials:      Medications  Scheduled Meds: . sodium chloride   Intravenous STAT  . enoxaparin (LOVENOX) injection  70 mg Subcutaneous BID  . piperacillin-tazobactam (ZOSYN)  IV  3.375 g Intravenous Q8H  . vancomycin  1,250 mg Intravenous Q12H   Continuous Infusions: . sodium chloride 125 mL/hr at 04/10/16 0645   PRN Meds:.acetaminophen **OR** acetaminophen, ondansetron **OR** ondansetron (ZOFRAN) IV   Antibiotics   Anti-infectives    Start     Dose/Rate Route Frequency Ordered Stop   04/10/16 0800  vancomycin (VANCOCIN) 1,250 mg in sodium chloride 0.9 % 250 mL IVPB     1,250 mg 166.7 mL/hr over 90 Minutes Intravenous Every 12 hours 04/10/16 0553     04/10/16 0630  piperacillin-tazobactam (ZOSYN) IVPB 3.375 g     3.375 g 12.5 mL/hr over 240 Minutes Intravenous Every 8 hours 04/10/16 0553     04/10/16 0600  piperacillin-tazobactam (ZOSYN) IVPB 3.375 g  Status:  Discontinued     3.375 g 100 mL/hr over 30 Minutes Intravenous  Once 04/10/16 0547 04/10/16 0602   04/10/16 0600  vancomycin (VANCOCIN) IVPB 1000 mg/200 mL premix  Status:  Discontinued     1,000 mg 200 mL/hr over 60 Minutes Intravenous  Once 04/10/16 0547 04/10/16 0602   04/09/16 2300  piperacillin-tazobactam (ZOSYN) IVPB 3.375 g     3.375 g 100 mL/hr over 30 Minutes Intravenous  Once 04/09/16 2245 04/09/16 2334   04/09/16 2300  vancomycin (VANCOCIN) IVPB 1000 mg/200 mL premix     1,000 mg 200 mL/hr over 60  Minutes Intravenous  Once 04/09/16 2245 04/10/16 0040        Subjective:   Douglas Parrish was seen and examined today. Left arm swollen, red and erythematous, warm, tender. Afebrile. Patient denies dizziness, chest pain, shortness of breath, abdominal pain, N/V/D/C, new weakness, numbess, tingling.   Objective:   Vitals:   04/10/16 0534 04/10/16 0742 04/10/16 0900 04/10/16 1045  BP:  (!) 88/62 92/70 92/63   Pulse:  87 93 92  Resp:  17 18 16   Temp:  97.5 F (36.4 C)    TempSrc:  Oral    SpO2:  99% 100% 98%  Weight: 70.6 kg (155 lb 10.3 oz)     Height: 5\' 9"  (1.753 m)       Intake/Output Summary (Last 24 hours) at 04/10/16 1159 Last data filed at 04/10/16 0645  Gross per 24 hour  Intake          4078.75 ml  Output                0 ml  Net          4078.75 ml     Wt Readings from Last 3 Encounters:  04/10/16 70.6 kg (155 lb 10.3 oz)     Exam  General: Alert and oriented x 3, NAD  HEENT:   Neck: Supple, no JVD, no masses  Cardiovascular: S1 S2 auscultated, no rubs, murmurs or gallops. Regular rate and rhythm.  Respiratory: Clear to auscultation bilaterally, no wheezing, rales or rhonchi  Gastrointestinal: Soft, nontender, nondistended, + bowel sounds  Ext: no cyanosis clubbing. Left arm swollen, erythematous, tender, track marks   Neuro: AAOx3, Cr N's II- XII. Strength 5/5 upper and lower extremities bilaterally  Skin: small ulceration on the left forearm and small scabs on the chin   Psych: Normal affect and demeanor, alert and oriented x3    Data Reviewed:  I have personally reviewed following labs and imaging studies  Micro Results Recent Results (from the past 240 hour(s))  MRSA PCR Screening     Status: None   Collection Time: 04/10/16  4:42 AM  Result Value Ref Range Status   MRSA by PCR NEGATIVE NEGATIVE Final    Comment:        The GeneXpert MRSA Assay (FDA approved for NASAL specimens only), is one component of a comprehensive MRSA  colonization surveillance program. It is not intended to diagnose MRSA infection nor to guide or monitor treatment for MRSA  infections.     Radiology Reports Dg Chest 2 View  Result Date: 04/10/2016 CLINICAL DATA:  31 y/o  M; chest complaints and leukocytosis. EXAM: CHEST  2 VIEW COMPARISON:  None. FINDINGS: Normal heart size. Prominent pulmonary markings, likely bronchitic changes. Opacity within the posterior costal diaphragmatic sulcus and in the retrosternal space on the lateral view. No pleural effusion or pneumothorax. Bones are unremarkable. IMPRESSION: Prominent pulmonary markings, likely bronchitic changes. Opacities within the retrosternal space and posterior costodiaphragmatic angle on the lateral view are suspicious for areas of pneumonia. The retrosternal focus is centrally lucent and may represent a pulmonary abscess/septic embolus. These results were called by telephone at the time of interpretation on 04/10/2016 at 2:24 am to Dr. Paula Libra , who verbally acknowledged these results. Electronically Signed   By: Mitzi Hansen M.D.   On: 04/10/2016 02:21   Ct Extrem Up Entire Arm L W/cm  Result Date: 04/10/2016 CLINICAL DATA:  Left upper extremity cellulitis. IV drug use with redness, pain and swelling for several days. Elevated white blood cell count. Question abscess. EXAM: CT OF THE UPPER LEFT EXTREMITY WITH CONTRAST TECHNIQUE: Multidetector CT imaging of the upper left extremity was performed according to the standard protocol following intravenous contrast administration. COMPARISON:  None. CONTRAST:  ISOVUE-300 IOPAMIDOL (ISOVUE-300) INJECTION 61% FINDINGS: Bones/Joint/Cartilage No fracture, periosteal reaction or bony destructive change. No evidence of elbow or shoulder joint effusion. There is a bone island in the scapula. Ligaments Suboptimally assessed by CT. Muscles and Tendons No evidence of intramuscular fluid collection. Edema suspected in the lateral  biceps musculature. Soft tissues Diffuse subcutaneous edema and skin thickening of the left upper extremity from the mid humerus distally. This appears most prominent about the distal humerus, elbow and forearm. Tiny skin defect about the lateral aspect of the mid forearm. No tracking soft tissue air. No peripherally enhancing collection to suggest abscess. Enlarged epitrochlear and axillary nodes are likely reactive. There is occlusive thrombosis throughout the basilic vein in the forearm and upper arm. Thrombus is also suspected of the cephalic vein in the forearm. Incidentally imaged paranasal sinus inflammation in the maxillary sinuses. IMPRESSION: 1. Diffuse cellulitis of the upper extremity without evidence of focal abscess. Tiny skin defect about the mid forearm, no tracking soft tissue air. 2. Edema within biceps musculature without gross intramuscular collection. 3. Thrombosis of the basilic and cephalic veins. 4. Epitrochlear and axillary lymph nodes are likely reactive. Electronically Signed   By: Rubye Oaks M.D.   On: 04/10/2016 01:59    Lab Data:  CBC:  Recent Labs Lab 04/09/16 2220 04/10/16 0840  WBC 25.4* 19.2*  NEUTROABS 21.8* 16.3*  HGB 12.4* 11.0*  HCT 35.4* 32.8*  MCV 86.1 86.8  PLT 277 215   Basic Metabolic Panel:  Recent Labs Lab 04/09/16 2220 04/10/16 0310 04/10/16 0840  NA 122*  122* 129* 137  K 3.6  3.5 3.3* 3.5  CL 90*  89* 98* 101  CO2 23  24 23 29   GLUCOSE 143*  143* 122* 119*  BUN 16  16 12 10   CREATININE 0.84  0.78 0.73 0.70  CALCIUM 8.2*  8.1* 7.4* 7.0*   GFR: Estimated Creatinine Clearance: 137.3 mL/min (by C-G formula based on SCr of 0.7 mg/dL). Liver Function Tests:  Recent Labs Lab 04/09/16 2220 04/10/16 0840  AST 70* 47*  ALT 40 32  ALKPHOS 73 91  BILITOT 0.9 1.1  PROT 6.5 7.3  ALBUMIN 2.7* 2.2*   No results for input(s):  LIPASE, AMYLASE in the last 168 hours. No results for input(s): AMMONIA in the last 168  hours. Coagulation Profile:  Recent Labs Lab 04/10/16 0840  INR 1.24   Cardiac Enzymes: No results for input(s): CKTOTAL, CKMB, CKMBINDEX, TROPONINI in the last 168 hours. BNP (last 3 results) No results for input(s): PROBNP in the last 8760 hours. HbA1C: No results for input(s): HGBA1C in the last 72 hours. CBG: No results for input(s): GLUCAP in the last 168 hours. Lipid Profile: No results for input(s): CHOL, HDL, LDLCALC, TRIG, CHOLHDL, LDLDIRECT in the last 72 hours. Thyroid Function Tests: No results for input(s): TSH, T4TOTAL, FREET4, T3FREE, THYROIDAB in the last 72 hours. Anemia Panel: No results for input(s): VITAMINB12, FOLATE, FERRITIN, TIBC, IRON, RETICCTPCT in the last 72 hours. Urine analysis:    Component Value Date/Time   COLORURINE AMBER (A) 04/10/2016 0001   APPEARANCEUR CLOUDY (A) 04/10/2016 0001   LABSPEC 1.016 04/10/2016 0001   PHURINE 6.0 04/10/2016 0001   GLUCOSEU NEGATIVE 04/10/2016 0001   HGBUR LARGE (A) 04/10/2016 0001   BILIRUBINUR NEGATIVE 04/10/2016 0001   KETONESUR NEGATIVE 04/10/2016 0001   PROTEINUR 100 (A) 04/10/2016 0001   NITRITE NEGATIVE 04/10/2016 0001   LEUKOCYTESUR NEGATIVE 04/10/2016 0001     Mega Kinkade M.D. [facility] 04/10/2016, 11:59 AM  Pager: [phone] Between 7am to 7pm - call Pager - [phone]  After 7pm go to [email or web address] - password [email or web address]  Call night coverage person covering after 7pm

## 2016-04-10 NOTE — ED Notes (Signed)
Patient transported to X-ray 

## 2016-04-10 NOTE — Consult Note (Signed)
ORTHOPAEDIC CONSULTATION  REQUESTING PHYSICIAN: Ripudeep Jenna Luo, MD  Chief Complaint: LUE cellulitis and infection  HPI: Douglas Parrish is a 31 y.o. male who presents with LUE cellulitis and pain and drainage for 3 days.  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  Denies any constitutional symptoms.  Pain has gotten slightly better since being admitted and on broad spectrum abx.  Denies any shoulder pain.  Ortho consulted.  CT scan showed cellulitis, neg for abscess, and superficial thrombosis of cephalic and basilic veins.  PMHx is negative for DM  History reviewed. No pertinent surgical history. Social History   Social History  . Marital status: Divorced    Spouse name: N/A  . Number of children: N/A  . Years of education: N/A   Social History Main Topics  . Smoking status: Current Every Day Smoker    Packs/day: 0.50    Types: Cigarettes  . Smokeless tobacco: Never Used  . Alcohol use No  . 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 Sexual activity: Not Asked   Other Topics Concern  . None   Social History Narrative  . None   Family History  Problem Relation Age of Onset  . CAD Neg Hx   . Diabetes Mellitus II Neg Hx    - negative except otherwise stated in the family history section No Known Allergies Prior to Admission medications   Medication Sig Start Date End Date Taking? Authorizing Provider  acetaminophen (TYLENOL) 325 MG tablet Take 650 mg by mouth every 6 (six) hours as needed for mild pain.   Yes Historical Provider, MD   Dg Chest 2 View  Result Date: 04/10/2016 CLINICAL DATA:  31 y/o  M; chest complaints and leukocytosis. EXAM: CHEST  2 VIEW COMPARISON:  None. FINDINGS: Normal heart size. Prominent pulmonary markings, likely bronchitic changes. Opacity within the posterior costal diaphragmatic sulcus and in the retrosternal space on the lateral view. No pleural effusion or pneumothorax. Bones are unremarkable. IMPRESSION: Prominent  pulmonary markings, likely bronchitic changes. Opacities within the retrosternal space and posterior costodiaphragmatic angle on the lateral view are suspicious for areas of pneumonia. The retrosternal focus is centrally lucent and may represent a pulmonary abscess/septic embolus. These results were called by telephone at the time of interpretation on 04/10/2016 at 2:24 am to Dr. Paula Libra , who verbally acknowledged these results. Electronically Signed   By: Mitzi Hansen M.D.   On: 04/10/2016 02:21   Ct Chest W Contrast  Result Date: 04/10/2016 CLINICAL DATA:  Fever EXAM: CT CHEST WITH CONTRAST TECHNIQUE: Multidetector CT imaging of the chest was performed during intravenous contrast administration. CONTRAST:  75mL ISOVUE-300 IOPAMIDOL (ISOVUE-300) INJECTION 61% COMPARISON:  Chest radiograph April 10, 2016 FINDINGS: Cardiovascular: There is no thoracic aortic aneurysm or dissection. Visualized great vessels appear unremarkable. There is mild atherosclerotic calcification in the aorta. The pericardium is not appreciably thickened. No major vessel pulmonary embolus is demonstrated. Mediastinum/Nodes: Visualized thyroid appears normal. There is a sub- carinal lymph node measuring 1.7 x 1.5 cm. There is a right hilar lymph node measuring 1.2 x 0.9 cm. Lungs/Pleura: There is airspace consolidation in both lower lobes, more severe on the right than on the left. There is a small focus of cavitation within the posterior segment right lower lobe consolidation. There are several small cavities in area of consolidation in the anterior segment of the right upper lobe. On axial slice 79 series 8,  there is a nodular opacity in the anterior segment of the right upper lobe measuring 6 x 6 mm. On axial slice 78 series 8, there is a knee 5 x 4 mm nodular opacity in the posterior segment of the right upper lobe. There is no appreciable pleural effusion or pleural thickening. Upper Abdomen: There is peripancreatic  fluid with prominence of the pancreas, findings indicative of a degree of acute pancreatitis. There is no well-defined pancreatic mass or pseudocyst in the regions which are visualized. Visualized upper abdomen otherwise appears unremarkable. Musculoskeletal: There are no blastic or lytic bone lesions. IMPRESSION: Multifocal pneumonia. Small areas of apparent cavitation noted in the right upper lobe and right lower lobe regions of consolidation. Gas-forming bacteria or granulomatous etiologies for these areas of pneumonia must be of concern. Mildly enlarged lymph nodes as noted above. These lymph nodes may well have reactive etiology given the areas of multifocal pneumonia. Incomplete visualization of the pancreas with evidence concerning for acute pancreatitis in the regions of the pancreas which are visualized. This finding may warrant CT abdomen and pelvis following oral and intravenous contrast to better delineate the pancreas and establish the extent of inflammation in this region. Mild atherosclerotic calcification in the aorta. These results will be called to the ordering clinician or representative by the Radiologist Assistant, and communication documented in the PACS or zVision Dashboard. Electronically Signed   By: Bretta Bang III M.D.   On: 04/10/2016 15:20   Ct Extrem Up Entire Arm L W/cm  Result Date: 04/10/2016 CLINICAL DATA:  Left upper extremity cellulitis. IV drug use with redness, pain and swelling for several days. Elevated white blood cell count. Question abscess. EXAM: CT OF THE UPPER LEFT EXTREMITY WITH CONTRAST TECHNIQUE: Multidetector CT imaging of the upper left extremity was performed according to the standard protocol following intravenous contrast administration. COMPARISON:  None. CONTRAST:  ISOVUE-300 IOPAMIDOL (ISOVUE-300) INJECTION 61% FINDINGS: Bones/Joint/Cartilage No fracture, periosteal reaction or bony destructive change. No evidence of elbow or shoulder joint  effusion. There is a bone island in the scapula. Ligaments Suboptimally assessed by CT. Muscles and Tendons No evidence of intramuscular fluid collection. Edema suspected in the lateral biceps musculature. Soft tissues Diffuse subcutaneous edema and skin thickening of the left upper extremity from the mid humerus distally. This appears most prominent about the distal humerus, elbow and forearm. Tiny skin defect about the lateral aspect of the mid forearm. No tracking soft tissue air. No peripherally enhancing collection to suggest abscess. Enlarged epitrochlear and axillary nodes are likely reactive. There is occlusive thrombosis throughout the basilic vein in the forearm and upper arm. Thrombus is also suspected of the cephalic vein in the forearm. Incidentally imaged paranasal sinus inflammation in the maxillary sinuses. IMPRESSION: 1. Diffuse cellulitis of the upper extremity without evidence of focal abscess. Tiny skin defect about the mid forearm, no tracking soft tissue air. 2. Edema within biceps musculature without gross intramuscular collection. 3. Thrombosis of the basilic and cephalic veins. 4. Epitrochlear and axillary lymph nodes are likely reactive. Electronically Signed   By: Rubye Oaks M.D.   On: 04/10/2016 01:59   - pertinent xrays, CT, MRI studies were reviewed and independently interpreted  Positive ROS: All other systems have been reviewed and were otherwise negative with the exception of those mentioned in the HPI and as above.  Physical Exam: General: Alert, no acute distress Cardiovascular: No pedal edema Respiratory: No cyanosis, no use of accessory musculature GI: No organomegaly, abdomen is soft and  non-tender Skin: No lesions in the area of chief complaint Neurologic: Sensation intact distally Psychiatric: Patient is competent for consent with normal mood and affect Lymphatic: No axillary or cervical lymphadenopathy  MUSCULOSKELETAL:  - LUE with severe cellulitis and  generalized pitting edema - hand wwp, 2+ radial pulse - painless ROM of elbow, wrist, shoulder - superficial wound with purulent drainage from forearm, does not probe deeper than subcutaneous layer  Assessment: LUE cellulitis Left forearm wound  Plan: - wound was probed and is draining adequately, does not need surgical decompression at this time - continue empiric abx, patient is responding to abx - will continue to follow patient for any signs of deep abscess or clinical worsening - wet to dry dressings to open wound until closed  Thank you for the consult and the opportunity to see Mr. Douglas Parrish  N. Glee ArvinMichael Xu, MD [facility] [phone] 4:53 PM

## 2016-04-11 DIAGNOSIS — F19188 Other psychoactive substance abuse with other psychoactive substance-induced disorder: Secondary | ICD-10-CM

## 2016-04-11 DIAGNOSIS — F17210 Nicotine dependence, cigarettes, uncomplicated: Secondary | ICD-10-CM

## 2016-04-11 DIAGNOSIS — B950 Streptococcus, group A, as the cause of diseases classified elsewhere: Secondary | ICD-10-CM

## 2016-04-11 DIAGNOSIS — J984 Other disorders of lung: Secondary | ICD-10-CM

## 2016-04-11 LAB — URINE CULTURE: CULTURE: NO GROWTH

## 2016-04-11 LAB — CBC
HCT: 30.6 % — ABNORMAL LOW (ref 39.0–52.0)
Hemoglobin: 10.3 g/dL — ABNORMAL LOW (ref 13.0–17.0)
MCH: 29.2 pg (ref 26.0–34.0)
MCHC: 33.7 g/dL (ref 30.0–36.0)
MCV: 86.7 fL (ref 78.0–100.0)
PLATELETS: 234 10*3/uL (ref 150–400)
RBC: 3.53 MIL/uL — ABNORMAL LOW (ref 4.22–5.81)
RDW: 15.4 % (ref 11.5–15.5)
WBC: 18.1 10*3/uL — ABNORMAL HIGH (ref 4.0–10.5)

## 2016-04-11 LAB — BASIC METABOLIC PANEL
Anion gap: 7 (ref 5–15)
BUN: 7 mg/dL (ref 6–20)
CALCIUM: 7.7 mg/dL — AB (ref 8.9–10.3)
CO2: 23 mmol/L (ref 22–32)
CREATININE: 0.58 mg/dL — AB (ref 0.61–1.24)
Chloride: 106 mmol/L (ref 101–111)
GFR calc Af Amer: >60 mL/min (ref >60)
GLUCOSE: 106 mg/dL — AB (ref 65–99)
Potassium: 3.2 mmol/L — ABNORMAL LOW (ref 3.5–5.1)
SODIUM: 136 mmol/L (ref 135–145)

## 2016-04-11 LAB — HIV ANTIBODY (ROUTINE TESTING W REFLEX): HIV Screen 4th Generation wRfx: NONREACTIVE

## 2016-04-11 MED ORDER — MORPHINE SULFATE (PF) 2 MG/ML IV SOLN
2.0000 mg | INTRAVENOUS | Status: AC
  Administered 2016-04-11 – 2016-04-12 (×6): 2 mg via INTRAVENOUS
  Filled 2016-04-11 (×6): qty 1

## 2016-04-11 MED ORDER — MORPHINE SULFATE (PF) 4 MG/ML IV SOLN
4.0000 mg | Freq: Once | INTRAVENOUS | Status: AC
  Administered 2016-04-11: 4 mg via INTRAVENOUS
  Filled 2016-04-11: qty 1

## 2016-04-11 MED ORDER — MORPHINE SULFATE (PF) 2 MG/ML IV SOLN
1.0000 mg | INTRAVENOUS | Status: DC | PRN
  Administered 2016-04-11 – 2016-04-12 (×3): 1 mg via INTRAVENOUS
  Filled 2016-04-11 (×3): qty 1

## 2016-04-11 MED ORDER — POTASSIUM CHLORIDE CRYS ER 20 MEQ PO TBCR
40.0000 meq | EXTENDED_RELEASE_TABLET | Freq: Once | ORAL | Status: AC
  Administered 2016-04-11: 40 meq via ORAL
  Filled 2016-04-11: qty 2

## 2016-04-11 NOTE — Progress Notes (Signed)
[facility]                                                                              Patient Demographics  Douglas Parrish, is a 31 y.o. male, DOB - February 02, 1986, ZOX:[id number]  Admit date - 04/09/2016   Admitting Physician Eduard Clos, MD  Outpatient Primary MD for the patient is No PCP Per Patient  Outpatient specialists:   LOS - 1  days    Chief Complaint  Patient presents with  . Abscess       Brief summary   Patient is a 31 year old male with history of IV drug use presented with worsening pain on the left forearm in the last 2-3 days. Patient admitted to have been injecting drugs in the same arm, subjective fevers and chills. CT scan of the left upper extremity showed a diffuse cellulitis with DVT. Patient was started on empiric antibiotics after blood cultures were obtained.   Assessment & Plan    Principal Problem:  Strep pyogenes bacteremia sepsis (HCC)From the left upper extremity cellulitis in the setting of IV drug use - Blood cultures positive for strep pyogenes - CT of the left arm showed diffuse cellulitis without any focal abscess, edema within the biceps musculature without gross intramuscular collection, thrombosis of the basilic and cephalic veins - ortho consulted, d/w Dr Xu-> currently no need for operative management, continue IV antibiotics - Keep left upper arm elevated - Pharmacy recommended IV clindamycin and penicillin with blood culture results, previously was on IV vancomycin and Zosyn, I have consulted infectious disease, discussed with Dr. Luciana Axe, will follow recommendations - 2-D echo showed no vegetations, follow ID recommendations,? TEE   Active Problems:   Deep venous thrombosis (HCC) LUE -  CT of the left arm showed thrombosis of the basilic and cephalic veins - Doppler US neg for DVT  Multifocal pneumonia:  - CT chest showed Multifocal pneumonia. Small areas of apparent cavitation noted in the right upper lobe  and right lower lobe regions of consolidation. Likely due to septic emboli -Previously was on IV vancomycin and Zosyn, now on IV clindamycin and penicillin, follow ID recommendations    Hyponatremia with lactic acidosis - Likely due to #1, improving with IV fluid hydration  Polysubstance abuse,  IV heroin abuse  - Patient counseled on drugs cessation  - Per patient he uses IV heroin half gram 3 times a day, injects . Starting IV heroin withdrawals, having tachycardia, palpitations, abdominal cramps, nausea, give morphine 4 mg IV times one, will place on scheduled morphine for 24 hours. Cannot give clonidine due to low BP.   Code Status:full  DVT Prophylaxis:  Lovenox  Family Communication: Discussed in detail with the patient, all imaging results, lab results explained to the patient  Disposition Plan:   Time Spent in minutes   Procedures:  CT left arm  Consultants:   Orthopedics  ID  Antimicrobials:   IV penicillin G 1/19  IV clindamycin 1/19    Medications  Scheduled Meds: . clindamycin (CLEOCIN) IV  600 mg Intravenous Q8H  . enoxaparin (LOVENOX) injection  40 mg Subcutaneous Q24H  .  morphine injection  2 mg Intravenous Q4H  . pencillin G potassium IV  4 Million Units Intravenous Q4H   Continuous Infusions: . sodium chloride 125 mL/hr at 04/11/16 0659   PRN Meds:.acetaminophen **OR** acetaminophen, morphine injection, ondansetron **OR** ondansetron (ZOFRAN) IV   Antibiotics   Anti-infectives    Start     Dose/Rate Route Frequency Ordered Stop   04/11/16 0000  penicillin G potassium 4 Million Units in dextrose 5 % 250 mL IVPB     4 Million Units 250 mL/hr over 60 Minutes Intravenous Every 4 hours 04/10/16 1944     04/10/16 2200  clindamycin (CLEOCIN) IVPB 600 mg     600 mg 100 mL/hr over 30 Minutes Intravenous Every 8 hours 04/10/16 1944     04/10/16 0800  vancomycin (VANCOCIN) 1,250 mg in sodium chloride 0.9 % 250 mL IVPB  Status:  Discontinued      1,250 mg 166.7 mL/hr over 90 Minutes Intravenous Every 12 hours 04/10/16 0553 04/10/16 1944   04/10/16 0630  piperacillin-tazobactam (ZOSYN) IVPB 3.375 g  Status:  Discontinued     3.375 g 12.5 mL/hr over 240 Minutes Intravenous Every 8 hours 04/10/16 0553 04/10/16 1944   04/10/16 0600  piperacillin-tazobactam (ZOSYN) IVPB 3.375 g  Status:  Discontinued     3.375 g 100 mL/hr over 30 Minutes Intravenous  Once 04/10/16 0547 04/10/16 0602   04/10/16 0600  vancomycin (VANCOCIN) IVPB 1000 mg/200 mL premix  Status:  Discontinued     1,000 mg 200 mL/hr over 60 Minutes Intravenous  Once 04/10/16 0547 04/10/16 0602   04/09/16 2300  piperacillin-tazobactam (ZOSYN) IVPB 3.375 g     3.375 g 100 mL/hr over 30 Minutes Intravenous  Once 04/09/16 2245 04/09/16 2334   04/09/16 2300  vancomycin (VANCOCIN) IVPB 1000 mg/200 mL premix     1,000 mg 200 mL/hr over 60 Minutes Intravenous  Once 04/09/16 2245 04/10/16 0040        Subjective:   Douglas Parrish was seen and examined today. Left arm feels better today. No fevers or chills. Complaining of palpitations, abdominal cramps, nausea. Feels heroin withdrawal symptoms. Patient denies dizziness, chest pain, shortness of breath, new weakness, numbess, tingling.   Objective:   Vitals:   04/10/16 2348 04/11/16 0344 04/11/16 0700 04/11/16 1218  BP: (!) 100/53 (!) 91/54 108/74 108/67  Pulse: 98 90 (!) 102 84  Resp: 17 (!) 24  20  Temp: 98.3 F (36.8 C) 98.2 F (36.8 C) 98.7 F (37.1 C) 98.5 F (36.9 C)  TempSrc: Oral Oral Oral Oral  SpO2: 98% 100%  98%  Weight:      Height:        Intake/Output Summary (Last 24 hours) at 04/11/16 1250 Last data filed at 04/11/16 1100  Gross per 24 hour  Intake          5031.25 ml  Output             2900 ml  Net          2131.25 ml     Wt Readings from Last 3 Encounters:  04/10/16 70.6 kg (155 lb 10.3 oz)     Exam  General: Alert and oriented x 3, NAD, sweaty  HEENT:   Neck: Supple, no JVD, no  masses  Cardiovascular: S1 S2 auscultated, tachycardia, regular rate and rhythm.  Respiratory: Clear to auscultation bilaterally, no wheezing, rales or rhonchi  Gastrointestinal: Soft, nontender, nondistended, + bowel sounds  Ext: no cyanosis clubbing. Left arm dressing  intact  Neuro: no new deficits   Skin: small ulceration on the left forearm and small scabs on the chin   Psych: Normal affect and demeanor, alert and oriented x3    Data Reviewed:  I have personally reviewed following labs and imaging studies  Micro Results Recent Results (from the past 240 hour(s))  Blood culture (routine x 2)     Status: None (Preliminary result)   Collection Time: 04/09/16 10:20 PM  Result Value Ref Range Status   Specimen Description BLOOD RIGHT ANTECUBITAL  Final   Special Requests   Final    BOTTLES DRAWN AEROBIC AND ANAEROBIC AER 5cc ANA 5cc   Culture  Setup Time   Final    GRAM POSITIVE COCCI IN CHAINS IN BOTH AEROBIC AND ANAEROBIC BOTTLES CRITICAL RESULT CALLED TO, READ BACK BY AND VERIFIED WITH: Chesley Mires, RPh 04/10/16 AT 1845 BY Mickel Fuchs    Culture   Final    GRAM POSITIVE COCCI CULTURE REINCUBATED FOR BETTER GROWTH Performed at [facility], [address] [place], [place] [address]    Report Status PENDING  Incomplete  Blood Culture ID Panel (Reflexed)     Status: Abnormal   Collection Time: 04/09/16 10:20 PM  Result Value Ref Range Status   Enterococcus species NOT DETECTED NOT DETECTED Final   Listeria monocytogenes NOT DETECTED NOT DETECTED Final   Staphylococcus species NOT DETECTED NOT DETECTED Final   Staphylococcus aureus NOT DETECTED NOT DETECTED Final   Streptococcus species DETECTED (A) NOT DETECTED Final    Comment: CRITICAL RESULT CALLED TO, READ BACK BY AND VERIFIED WITH: R. CLARK, RPh, 04/10/16 at 1845 BY J. FUDESCO    Streptococcus agalactiae NOT DETECTED NOT DETECTED Final   Streptococcus pneumoniae NOT DETECTED NOT DETECTED Final   Streptococcus  pyogenes DETECTED (A) NOT DETECTED Final    Comment: CRITICAL RESULT CALLED TO, READ BACK BY AND VERIFIED WITH:  R CLARK, RPh, 04/10/16 at 1845 BY J. FUDESCO    Acinetobacter baumannii NOT DETECTED NOT DETECTED Final   Enterobacteriaceae species NOT DETECTED NOT DETECTED Final   Enterobacter cloacae complex NOT DETECTED NOT DETECTED Final   Escherichia coli NOT DETECTED NOT DETECTED Final   Klebsiella oxytoca NOT DETECTED NOT DETECTED Final   Klebsiella pneumoniae NOT DETECTED NOT DETECTED Final   Proteus species NOT DETECTED NOT DETECTED Final   Serratia marcescens NOT DETECTED NOT DETECTED Final   Haemophilus influenzae NOT DETECTED NOT DETECTED Final   Neisseria meningitidis NOT DETECTED NOT DETECTED Final   Pseudomonas aeruginosa NOT DETECTED NOT DETECTED Final   Candida albicans NOT DETECTED NOT DETECTED Final   Candida glabrata NOT DETECTED NOT DETECTED Final   Candida krusei NOT DETECTED NOT DETECTED Final   Candida parapsilosis NOT DETECTED NOT DETECTED Final   Candida tropicalis NOT DETECTED NOT DETECTED Final    Comment: Performed at [facility], [address], [place], [place] [address]  Blood culture (routine x 2)     Status: None (Preliminary result)   Collection Time: 04/09/16 10:31 PM  Result Value Ref Range Status   Specimen Description BLOOD RIGHT FOREARM  Final   Special Requests   Final    BOTTLES DRAWN AEROBIC AND ANAEROBIC AER 5cc ANA 5cc   Culture  Setup Time   Final    GRAM POSITIVE COCCI IN CHAINS ANAEROBIC BOTTLE ONLY CRITICAL VALUE NOTED.  VALUE IS CONSISTENT WITH PREVIOUSLY REPORTED AND CALLED VALUE.    Culture   Final  GRAM POSITIVE COCCI CULTURE REINCUBATED FOR BETTER GROWTH Performed at [facility], [address] [place], [place] [address]    Report Status PENDING  Incomplete  Urine culture     Status: None   Collection Time: 04/10/16 12:01 AM  Result Value Ref Range Status   Specimen Description URINE, RANDOM  Final   Special  Requests NONE  Final   Culture   Final    NO GROWTH Performed at [facility], [address] [place], [place] [address]    Report Status 04/11/2016 FINAL  Final  MRSA PCR Screening     Status: None   Collection Time: 04/10/16  4:42 AM  Result Value Ref Range Status   MRSA by PCR NEGATIVE NEGATIVE Final    Comment:        The GeneXpert MRSA Assay (FDA approved for NASAL specimens only), is one component of a comprehensive MRSA colonization surveillance program. It is not intended to diagnose MRSA infection nor to guide or monitor treatment for MRSA infections.     Radiology Reports Dg Chest 2 View  Result Date: 04/10/2016 CLINICAL DATA:  31 y/o  M; chest complaints and leukocytosis. EXAM: CHEST  2 VIEW COMPARISON:  None. FINDINGS: Normal heart size. Prominent pulmonary markings, likely bronchitic changes. Opacity within the posterior costal diaphragmatic sulcus and in the retrosternal space on the lateral view. No pleural effusion or pneumothorax. Bones are unremarkable. IMPRESSION: Prominent pulmonary markings, likely bronchitic changes. Opacities within the retrosternal space and posterior costodiaphragmatic angle on the lateral view are suspicious for areas of pneumonia. The retrosternal focus is centrally lucent and may represent a pulmonary abscess/septic embolus. These results were called by telephone at the time of interpretation on 04/10/2016 at 2:24 am to Dr. Paula Libra , who verbally acknowledged these results. Electronically Signed   By: Mitzi Hansen M.D.   On: 04/10/2016 02:21   Ct Chest W Contrast  Result Date: 04/10/2016 CLINICAL DATA:  Fever EXAM: CT CHEST WITH CONTRAST TECHNIQUE: Multidetector CT imaging of the chest was performed during intravenous contrast administration. CONTRAST:  75mL ISOVUE-300 IOPAMIDOL (ISOVUE-300) INJECTION 61% COMPARISON:  Chest radiograph April 10, 2016 FINDINGS: Cardiovascular: There is no thoracic aortic aneurysm or  dissection. Visualized great vessels appear unremarkable. There is mild atherosclerotic calcification in the aorta. The pericardium is not appreciably thickened. No major vessel pulmonary embolus is demonstrated. Mediastinum/Nodes: Visualized thyroid appears normal. There is a sub- carinal lymph node measuring 1.7 x 1.5 cm. There is a right hilar lymph node measuring 1.2 x 0.9 cm. Lungs/Pleura: There is airspace consolidation in both lower lobes, more severe on the right than on the left. There is a small focus of cavitation within the posterior segment right lower lobe consolidation. There are several small cavities in area of consolidation in the anterior segment of the right upper lobe. On axial slice 79 series 8, there is a nodular opacity in the anterior segment of the right upper lobe measuring 6 x 6 mm. On axial slice 78 series 8, there is a knee 5 x 4 mm nodular opacity in the posterior segment of the right upper lobe. There is no appreciable pleural effusion or pleural thickening. Upper Abdomen: There is peripancreatic fluid with prominence of the pancreas, findings indicative of a degree of acute pancreatitis. There is no well-defined pancreatic mass or pseudocyst in the regions which are visualized. Visualized upper abdomen otherwise appears unremarkable. Musculoskeletal: There are no blastic or lytic bone lesions. IMPRESSION: Multifocal pneumonia.  Small areas of apparent cavitation noted in the right upper lobe and right lower lobe regions of consolidation. Gas-forming bacteria or granulomatous etiologies for these areas of pneumonia must be of concern. Mildly enlarged lymph nodes as noted above. These lymph nodes may well have reactive etiology given the areas of multifocal pneumonia. Incomplete visualization of the pancreas with evidence concerning for acute pancreatitis in the regions of the pancreas which are visualized. This finding may warrant CT abdomen and pelvis following oral and intravenous  contrast to better delineate the pancreas and establish the extent of inflammation in this region. Mild atherosclerotic calcification in the aorta. These results will be called to the ordering clinician or representative by the Radiologist Assistant, and communication documented in the PACS or zVision Dashboard. Electronically Signed   By: Bretta Bang III M.D.   On: 04/10/2016 15:20   Ct Extrem Up Entire Arm L W/cm  Result Date: 04/10/2016 CLINICAL DATA:  Left upper extremity cellulitis. IV drug use with redness, pain and swelling for several days. Elevated white blood cell count. Question abscess. EXAM: CT OF THE UPPER LEFT EXTREMITY WITH CONTRAST TECHNIQUE: Multidetector CT imaging of the upper left extremity was performed according to the standard protocol following intravenous contrast administration. COMPARISON:  None. CONTRAST:  ISOVUE-300 IOPAMIDOL (ISOVUE-300) INJECTION 61% FINDINGS: Bones/Joint/Cartilage No fracture, periosteal reaction or bony destructive change. No evidence of elbow or shoulder joint effusion. There is a bone island in the scapula. Ligaments Suboptimally assessed by CT. Muscles and Tendons No evidence of intramuscular fluid collection. Edema suspected in the lateral biceps musculature. Soft tissues Diffuse subcutaneous edema and skin thickening of the left upper extremity from the mid humerus distally. This appears most prominent about the distal humerus, elbow and forearm. Tiny skin defect about the lateral aspect of the mid forearm. No tracking soft tissue air. No peripherally enhancing collection to suggest abscess. Enlarged epitrochlear and axillary nodes are likely reactive. There is occlusive thrombosis throughout the basilic vein in the forearm and upper arm. Thrombus is also suspected of the cephalic vein in the forearm. Incidentally imaged paranasal sinus inflammation in the maxillary sinuses. IMPRESSION: 1. Diffuse cellulitis of the upper extremity without  evidence of focal abscess. Tiny skin defect about the mid forearm, no tracking soft tissue air. 2. Edema within biceps musculature without gross intramuscular collection. 3. Thrombosis of the basilic and cephalic veins. 4. Epitrochlear and axillary lymph nodes are likely reactive. Electronically Signed   By: Rubye Oaks M.D.   On: 04/10/2016 01:59    Lab Data:  CBC:  Recent Labs Lab 04/09/16 2220 04/10/16 0840 04/11/16 0523  WBC 25.4* 19.2* 18.1*  NEUTROABS 21.8* 16.3*  --   HGB 12.4* 11.0* 10.3*  HCT 35.4* 32.8* 30.6*  MCV 86.1 86.8 86.7  PLT 277 215 234   Basic Metabolic Panel:  Recent Labs Lab 04/09/16 2220 04/10/16 0310 04/10/16 0840 04/11/16 0523  NA 122*  122* 129* 137 136  K 3.6  3.5 3.3* 3.5 3.2*  CL 90*  89* 98* 101 106  CO2 23  24 23 29 23   GLUCOSE 143*  143* 122* 119* 106*  BUN 16  16 12 10 7   CREATININE 0.84  0.78 0.73 0.70 0.58*  CALCIUM 8.2*  8.1* 7.4* 7.0* 7.7*   GFR: Estimated Creatinine Clearance: 137.3 mL/min (by C-G formula based on SCr of 0.58 mg/dL (L)). Liver Function Tests:  Recent Labs Lab 04/09/16 2220 04/10/16 0840  AST 70* 47*  ALT 40 32  ALKPHOS  73 91  BILITOT 0.9 1.1  PROT 6.5 7.3  ALBUMIN 2.7* 2.2*    Recent Labs Lab 04/10/16 1554  LIPASE 15   No results for input(s): AMMONIA in the last 168 hours. Coagulation Profile:  Recent Labs Lab 04/10/16 0840  INR 1.24   Cardiac Enzymes: No results for input(s): CKTOTAL, CKMB, CKMBINDEX, TROPONINI in the last 168 hours. BNP (last 3 results) No results for input(s): PROBNP in the last 8760 hours. HbA1C: No results for input(s): HGBA1C in the last 72 hours. CBG: No results for input(s): GLUCAP in the last 168 hours. Lipid Profile: No results for input(s): CHOL, HDL, LDLCALC, TRIG, CHOLHDL, LDLDIRECT in the last 72 hours. Thyroid Function Tests: No results for input(s): TSH, T4TOTAL, FREET4, T3FREE, THYROIDAB in the last 72 hours. Anemia Panel: No results for  input(s): VITAMINB12, FOLATE, FERRITIN, TIBC, IRON, RETICCTPCT in the last 72 hours. Urine analysis:    Component Value Date/Time   COLORURINE AMBER (A) 04/10/2016 0001   APPEARANCEUR CLOUDY (A) 04/10/2016 0001   LABSPEC 1.016 04/10/2016 0001   PHURINE 6.0 04/10/2016 0001   GLUCOSEU NEGATIVE 04/10/2016 0001   HGBUR LARGE (A) 04/10/2016 0001   BILIRUBINUR NEGATIVE 04/10/2016 0001   KETONESUR NEGATIVE 04/10/2016 0001   PROTEINUR 100 (A) 04/10/2016 0001   NITRITE NEGATIVE 04/10/2016 0001   LEUKOCYTESUR NEGATIVE 04/10/2016 0001     Weaver Tweed M.D. [facility] 04/11/2016, 12:50 PM  Pager: [phone] Between 7am to 7pm - call Pager - [phone]  After 7pm go to [email or web address] - password [email or web address]  Call night coverage person covering after 7pm

## 2016-04-11 NOTE — Consult Note (Signed)
[facility]       Reason for Consult: GAS bacteremia, cellulitis, pulmonary cavitary lesions    Referring Physician: Dr. Isidoro Donning  Principal Problem:   Sepsis [facility]) Active Problems:   Hyponatremia   Heroin abuse   Cellulitis of left upper extremity   Deep venous thrombosis (HCC)   Acute septic pulmonary embolism without acute cor pulmonale (HCC)   Abnormal CXR   . clindamycin (CLEOCIN) IV  600 mg Intravenous Q8H  . enoxaparin (LOVENOX) injection  40 mg Subcutaneous Q24H  .  morphine injection  2 mg Intravenous Q4H  . pencillin G potassium IV  4 Million Units Intravenous Q4H    Recommendations: TEE Continue penicillin and clindamycin  repeat blood cultures tomorrow  Assessment: He has GAS with cellulitis and cavitary areas of lungs concerning for septic emboli.  GAS is not a typical cause of endcarditis but certainly on the differential as well as from the peripheral vein from the left arm.  With cavitation, could have some toxin producing GAS so will continue with clindamycin for now.    Antibiotics: Penicillin and clindamycin  HPI: Douglas Parrish is a 31 y.o. male IVDU and no other pmh who presented with left arm pain, swelling, fever, chills c/w cellulitis.  Had injected heroin and over 2 days got progressively worse.  No cough, no chest pain.  CT also with consolidation in right upper and lower lobes with cavitation.  No history of any infection in the past.  No lung disease. No associated n/v/d.  CT independently reviewed  Review of Systems:  Constitutional: negative for fevers, chills, fatigue and anorexia Gastrointestinal: negative for diarrhea Integument/breast: negative for rash Musculoskeletal: negative for myalgias and arthralgias Neurological: negative for headaches All other systems reviewed and are negative    PMH: IVDU  Social History  Substance Use Topics  . Smoking status: Current Every Day Smoker    Packs/day: 0.50   Types: Cigarettes  . Smokeless tobacco: Never Used  . Alcohol use No    Family History  Problem Relation Age of Onset  . CAD Neg Hx   . Diabetes Mellitus II Neg Hx     No Known Allergies  Physical Exam: Constitutional: in no apparent distress  Vitals:   04/11/16 0700 04/11/16 1218  BP: 108/74 108/67  Pulse: (!) 102 84  Resp:  20  Temp: 98.7 F (37.1 C) 98.5 F (36.9 C)   EYES: anicteric ENMT: no thrush Cardiovascular: Cor RRR Respiratory: CTA B; normal respiratory effort GI: Bowel sounds are normal, liver is not enlarged, spleen is not enlarged Musculoskeletal: no pedal edema noted Skin: right arm with edema, erythema, tenderness Neuro: non focal  Lab Results  Component Value Date   WBC 18.1 (H) 04/11/2016   HGB 10.3 (L) 04/11/2016   HCT 30.6 (L) 04/11/2016   MCV 86.7 04/11/2016   PLT 234 04/11/2016    Lab Results  Component Value Date   CREATININE 0.58 (L) 04/11/2016   BUN 7 04/11/2016   NA 136 04/11/2016   K 3.2 (L) 04/11/2016   CL 106 04/11/2016   CO2 23 04/11/2016    Lab Results  Component Value Date   ALT 32 04/10/2016   AST 47 (H) 04/10/2016   ALKPHOS 91 04/10/2016     Microbiology: Recent Results (from the past 240 hour(s))  Blood culture (routine x 2)     Status: None (Preliminary result)   Collection Time: 04/09/16 10:20 PM  Result Value Ref Range  Status   Specimen Description BLOOD RIGHT ANTECUBITAL  Final   Special Requests   Final    BOTTLES DRAWN AEROBIC AND ANAEROBIC AER 5cc ANA 5cc   Culture  Setup Time   Final    GRAM POSITIVE COCCI IN CHAINS IN BOTH AEROBIC AND ANAEROBIC BOTTLES CRITICAL RESULT CALLED TO, READ BACK BY AND VERIFIED WITH: RKemper Durie, RPh 04/10/16 AT 1845 BY Mickel Fuchs    Culture   Final    GRAM POSITIVE COCCI CULTURE REINCUBATED FOR BETTER GROWTH Performed at [facility], [address] [place], [place] [address]    Report Status PENDING  Incomplete  Blood Culture ID Panel (Reflexed)     Status: Abnormal    Collection Time: 04/09/16 10:20 PM  Result Value Ref Range Status   Enterococcus species NOT DETECTED NOT DETECTED Final   Listeria monocytogenes NOT DETECTED NOT DETECTED Final   Staphylococcus species NOT DETECTED NOT DETECTED Final   Staphylococcus aureus NOT DETECTED NOT DETECTED Final   Streptococcus species DETECTED (A) NOT DETECTED Final    Comment: CRITICAL RESULT CALLED TO, READ BACK BY AND VERIFIED WITH: R. CLARK, RPh, 04/10/16 at 1845 BY J. FUDESCO    Streptococcus agalactiae NOT DETECTED NOT DETECTED Final   Streptococcus pneumoniae NOT DETECTED NOT DETECTED Final   Streptococcus pyogenes DETECTED (A) NOT DETECTED Final    Comment: CRITICAL RESULT CALLED TO, READ BACK BY AND VERIFIED WITH:  R CLARK, RPh, 04/10/16 at 1845 BY J. FUDESCO    Acinetobacter baumannii NOT DETECTED NOT DETECTED Final   Enterobacteriaceae species NOT DETECTED NOT DETECTED Final   Enterobacter cloacae complex NOT DETECTED NOT DETECTED Final   Escherichia coli NOT DETECTED NOT DETECTED Final   Klebsiella oxytoca NOT DETECTED NOT DETECTED Final   Klebsiella pneumoniae NOT DETECTED NOT DETECTED Final   Proteus species NOT DETECTED NOT DETECTED Final   Serratia marcescens NOT DETECTED NOT DETECTED Final   Haemophilus influenzae NOT DETECTED NOT DETECTED Final   Neisseria meningitidis NOT DETECTED NOT DETECTED Final   Pseudomonas aeruginosa NOT DETECTED NOT DETECTED Final   Candida albicans NOT DETECTED NOT DETECTED Final   Candida glabrata NOT DETECTED NOT DETECTED Final   Candida krusei NOT DETECTED NOT DETECTED Final   Candida parapsilosis NOT DETECTED NOT DETECTED Final   Candida tropicalis NOT DETECTED NOT DETECTED Final    Comment: Performed at [facility], [address] [place], [place] [address]  Blood culture (routine x 2)     Status: None (Preliminary result)   Collection Time: 04/09/16 10:31 PM  Result Value Ref Range Status   Specimen Description BLOOD RIGHT FOREARM  Final    Special Requests   Final    BOTTLES DRAWN AEROBIC AND ANAEROBIC AER 5cc ANA 5cc   Culture  Setup Time   Final    GRAM POSITIVE COCCI IN CHAINS ANAEROBIC BOTTLE ONLY CRITICAL VALUE NOTED.  VALUE IS CONSISTENT WITH PREVIOUSLY REPORTED AND CALLED VALUE.    Culture   Final    GRAM POSITIVE COCCI CULTURE REINCUBATED FOR BETTER GROWTH Performed at [facility], [address] [place], [place] [address]    Report Status PENDING  Incomplete  Urine culture     Status: None   Collection Time: 04/10/16 12:01 AM  Result Value Ref Range Status   Specimen Description URINE, RANDOM  Final   Special Requests NONE  Final   Culture   Final    NO GROWTH Performed at [facility]  [facility], [address] [place], [place] [id number]    Report Status 04/11/2016 FINAL  Final  MRSA PCR Screening     Status: None   Collection Time: 04/10/16  4:42 AM  Result Value Ref Range Status   MRSA by PCR NEGATIVE NEGATIVE Final    Comment:        The GeneXpert MRSA Assay (FDA approved for NASAL specimens only), is one component of a comprehensive MRSA colonization surveillance program. It is not intended to diagnose MRSA infection nor to guide or monitor treatment for MRSA infections.     Staci RighterOMER, ROBERT, MD [email or web address] [phone] pager  [phone] cell 04/11/2016, 1:26 PM

## 2016-04-12 LAB — CULTURE, BLOOD (ROUTINE X 2)

## 2016-04-12 MED ORDER — MORPHINE SULFATE (PF) 2 MG/ML IV SOLN
2.0000 mg | INTRAVENOUS | Status: AC
  Administered 2016-04-12 – 2016-04-13 (×8): 2 mg via INTRAVENOUS
  Filled 2016-04-12 (×8): qty 1

## 2016-04-12 MED ORDER — MORPHINE SULFATE (PF) 2 MG/ML IV SOLN
2.0000 mg | INTRAVENOUS | Status: DC | PRN
  Administered 2016-04-12 – 2016-04-14 (×4): 2 mg via INTRAVENOUS
  Filled 2016-04-12 (×5): qty 1

## 2016-04-12 NOTE — Progress Notes (Signed)
[facility]                                                                              Patient Demographics  Douglas Parrish, is a 31 y.o. male, DOB - 1985/06/24, ZOX:[id number]  Admit date - 04/09/2016   Admitting Physician Eduard Clos, MD  Outpatient Primary MD for the patient is No PCP Per Patient  Outpatient specialists:   LOS - 2  days    Chief Complaint  Patient presents with  . Abscess       Brief summary   Patient is a 31 year old male with history of IV drug use presented with worsening pain on the left forearm in the last 2-3 days. Patient admitted to have been injecting drugs in the same arm, subjective fevers and chills. CT scan of the left upper extremity showed a diffuse cellulitis with DVT. Patient was started on empiric antibiotics after blood cultures were obtained.   Assessment & Plan    Principal Problem:  Strep pyogenes bacteremia sepsis (HCC)From the left upper extremity cellulitis in the setting of IV drug use - Blood cultures positive for strep pyogenes - CT of the left arm showed diffuse cellulitis without any focal abscess, edema within the biceps musculature without gross intramuscular collection, thrombosis of the basilic and cephalic veins - ortho consulted, d/w Dr Xu-> currently no need for operative management, continue IV antibiotics - Keep left upper arm elevated - Continue IV clindamycin and penicillin ID following - 2-D echo showed no vegetations, cardiology consulted for TEE, nothing by mouth after midnight  - Repeat blood cultures today   Active Problems:  Superficial venous thrombosis (HCC) LUE -  CT of the left arm showed thrombosis of the basilic and cephalic veins - Doppler US neg for DVT, superficial thrombosis in the left cephalic and basilic veins  Multifocal cavitary pneumonia:  - CT chest showed Multifocal pneumonia. Small areas of apparent cavitation noted in the right upper lobe and right lower lobe  regions of consolidation. Likely due to septic emboli -Previously was on IV vancomycin and Zosyn, now on IV clindamycin and penicillin, follow ID recommendations    Hyponatremia with lactic acidosis - Likely due to #1, improving with IV fluid hydration  Polysubstance abuse,  IV heroin abuse  - Patient counseled on drugs cessation  - Per patient he uses IV heroin half gram 3 times a day, injects . Continue scheduled morphine today again for heroin withdrawals. Cannot give clonidine due to low BP.  - States willing to quit, social work consult placed  Code Status:full  DVT Prophylaxis:  Lovenox  Family Communication: Discussed in detail with the patient, all imaging results, lab results explained to the patient  Disposition Plan:   Time Spent in minutes   Procedures:  CT left arm  Consultants:   Orthopedics  ID  Antimicrobials:   IV penicillin G 1/19  IV clindamycin 1/19    Medications  Scheduled Meds: . clindamycin (CLEOCIN) IV  600 mg Intravenous Q8H  . enoxaparin (LOVENOX) injection  40 mg Subcutaneous Q24H  .  morphine injection  2 mg Intravenous Q3H  . pencillin  G potassium IV  4 Million Units Intravenous Q4H   Continuous Infusions: . sodium chloride 125 mL/hr at 04/11/16 2000   PRN Meds:.acetaminophen **OR** acetaminophen, morphine injection, ondansetron **OR** ondansetron (ZOFRAN) IV   Antibiotics   Anti-infectives    Start     Dose/Rate Route Frequency Ordered Stop   04/11/16 0000  penicillin G potassium 4 Million Units in dextrose 5 % 250 mL IVPB     4 Million Units 250 mL/hr over 60 Minutes Intravenous Every 4 hours 04/10/16 1944     04/10/16 2200  clindamycin (CLEOCIN) IVPB 600 mg     600 mg 100 mL/hr over 30 Minutes Intravenous Every 8 hours 04/10/16 1944     04/10/16 0800  vancomycin (VANCOCIN) 1,250 mg in sodium chloride 0.9 % 250 mL IVPB  Status:  Discontinued     1,250 mg 166.7 mL/hr over 90 Minutes Intravenous Every 12 hours 04/10/16  0553 04/10/16 1944   04/10/16 0630  piperacillin-tazobactam (ZOSYN) IVPB 3.375 g  Status:  Discontinued     3.375 g 12.5 mL/hr over 240 Minutes Intravenous Every 8 hours 04/10/16 0553 04/10/16 1944   04/10/16 0600  piperacillin-tazobactam (ZOSYN) IVPB 3.375 g  Status:  Discontinued     3.375 g 100 mL/hr over 30 Minutes Intravenous  Once 04/10/16 0547 04/10/16 0602   04/10/16 0600  vancomycin (VANCOCIN) IVPB 1000 mg/200 mL premix  Status:  Discontinued     1,000 mg 200 mL/hr over 60 Minutes Intravenous  Once 04/10/16 0547 04/10/16 0602   04/09/16 2300  piperacillin-tazobactam (ZOSYN) IVPB 3.375 g     3.375 g 100 mL/hr over 30 Minutes Intravenous  Once 04/09/16 2245 04/09/16 2334   04/09/16 2300  vancomycin (VANCOCIN) IVPB 1000 mg/200 mL premix     1,000 mg 200 mL/hr over 60 Minutes Intravenous  Once 04/09/16 2245 04/10/16 0040        Subjective:   Douglas Parrish was seen and examined today.Left arm improving, no fevers or chills.  Withdrawal symptoms better after starting morphine however states does not last long. Patient denies dizziness, chest pain, shortness of breath, new weakness, numbess, tingling.   Objective:   Vitals:   04/12/16 0400 04/12/16 0701 04/12/16 0800 04/12/16 1107  BP: 115/78 118/82 118/85 115/76  Pulse: 81 (!) 102 89 97  Resp: (!) 24 17 (!) 24 (!) 22  Temp:  98.3 F (36.8 C)    TempSrc:  Oral    SpO2: 96% 97% 97% 99%  Weight:      Height:        Intake/Output Summary (Last 24 hours) at 04/12/16 1314 Last data filed at 04/12/16 1100  Gross per 24 hour  Intake             4525 ml  Output             4925 ml  Net             -400 ml     Wt Readings from Last 3 Encounters:  04/10/16 70.6 kg (155 lb 10.3 oz)     Exam  General: Alert and oriented x 3, NAD,   HEENT:   Neck: Supple, no JVD, no masses  Cardiovascular: S1 S2 clear, RRR  Respiratory: CTAB  Gastrointestinal: Soft, nontender, nondistended, + bowel sounds  Ext: no cyanosis  clubbing.cellulitis improving  Neuro: no new deficits   Skin: Cellulitis improving  Psych: Normal affect and demeanor, alert and oriented x3    Data Reviewed:  I  have personally reviewed following labs and imaging studies  Micro Results Recent Results (from the past 240 hour(s))  Blood culture (routine x 2)     Status: Abnormal   Collection Time: 04/09/16 10:20 PM  Result Value Ref Range Status   Specimen Description BLOOD RIGHT ANTECUBITAL  Final   Special Requests   Final    BOTTLES DRAWN AEROBIC AND ANAEROBIC AER 5cc ANA 5cc   Culture  Setup Time   Final    GRAM POSITIVE COCCI IN CHAINS IN BOTH AEROBIC AND ANAEROBIC BOTTLES CRITICAL RESULT CALLED TO, READ BACK BY AND VERIFIED WITH: Chesley Mires, RPh 04/10/16 AT 1845 BY J. FUDESCO    Culture (A)  Final    GROUP A STREP (S.PYOGENES) ISOLATED HEALTH DEPARTMENT NOTIFIED Performed at [facility], [address], [place], [place] [address]    Report Status 04/12/2016 FINAL  Final   Organism ID, Bacteria GROUP A STREP (S.PYOGENES) ISOLATED  Final      Susceptibility   Group a strep (s.pyogenes) isolated - MIC*    ERYTHROMYCIN >=8 RESISTANT Resistant     TETRACYCLINE >=16 RESISTANT Resistant     VANCOMYCIN <=0.12 SENSITIVE Sensitive     CLINDAMYCIN >=1 RESISTANT Resistant     Inducible Clindamycin NEGATIVE Sensitive     PENICILLIN Value in next row Sensitive      SENSITIVE<=0.06    CEFTRIAXONE Value in next row Sensitive      SENSITIVE<=0.12    * GROUP A STREP (S.PYOGENES) ISOLATED  Blood Culture ID Panel (Reflexed)     Status: Abnormal   Collection Time: 04/09/16 10:20 PM  Result Value Ref Range Status   Enterococcus species NOT DETECTED NOT DETECTED Final   Listeria monocytogenes NOT DETECTED NOT DETECTED Final   Staphylococcus species NOT DETECTED NOT DETECTED Final   Staphylococcus aureus NOT DETECTED NOT DETECTED Final   Streptococcus species DETECTED (A) NOT DETECTED Final    Comment: CRITICAL RESULT CALLED TO,  READ BACK BY AND VERIFIED WITH: R. CLARK, RPh, 04/10/16 at 1845 BY J. FUDESCO    Streptococcus agalactiae NOT DETECTED NOT DETECTED Final   Streptococcus pneumoniae NOT DETECTED NOT DETECTED Final   Streptococcus pyogenes DETECTED (A) NOT DETECTED Final    Comment: CRITICAL RESULT CALLED TO, READ BACK BY AND VERIFIED WITH:  R CLARK, RPh, 04/10/16 at 1845 BY J. FUDESCO    Acinetobacter baumannii NOT DETECTED NOT DETECTED Final   Enterobacteriaceae species NOT DETECTED NOT DETECTED Final   Enterobacter cloacae complex NOT DETECTED NOT DETECTED Final   Escherichia coli NOT DETECTED NOT DETECTED Final   Klebsiella oxytoca NOT DETECTED NOT DETECTED Final   Klebsiella pneumoniae NOT DETECTED NOT DETECTED Final   Proteus species NOT DETECTED NOT DETECTED Final   Serratia marcescens NOT DETECTED NOT DETECTED Final   Haemophilus influenzae NOT DETECTED NOT DETECTED Final   Neisseria meningitidis NOT DETECTED NOT DETECTED Final   Pseudomonas aeruginosa NOT DETECTED NOT DETECTED Final   Candida albicans NOT DETECTED NOT DETECTED Final   Candida glabrata NOT DETECTED NOT DETECTED Final   Candida krusei NOT DETECTED NOT DETECTED Final   Candida parapsilosis NOT DETECTED NOT DETECTED Final   Candida tropicalis NOT DETECTED NOT DETECTED Final    Comment: Performed at [facility], [address], [place], [place] [address]  Blood culture (routine x 2)     Status: Abnormal   Collection Time: 04/09/16 10:31 PM  Result Value Ref Range Status   Specimen Description BLOOD RIGHT FOREARM  Final   Special Requests   Final    BOTTLES DRAWN AEROBIC AND ANAEROBIC AER 5cc ANA 5cc   Culture  Setup Time   Final    GRAM POSITIVE COCCI IN CHAINS IN BOTH AEROBIC AND ANAEROBIC BOTTLES CRITICAL VALUE NOTED.  VALUE IS CONSISTENT WITH PREVIOUSLY REPORTED AND CALLED VALUE.    Culture (A)  Final    GROUP A STREP (S.PYOGENES) ISOLATED SUSCEPTIBILITIES PERFORMED ON PREVIOUS CULTURE WITHIN THE LAST 5 DAYS. HEALTH  DEPARTMENT NOTIFIED Performed at [facility], [address] [place], [place] [address]    Report Status 04/12/2016 FINAL  Final  Urine culture     Status: None   Collection Time: 04/10/16 12:01 AM  Result Value Ref Range Status   Specimen Description URINE, RANDOM  Final   Special Requests NONE  Final   Culture   Final    NO GROWTH Performed at [facility], [address], [place], [place] [address]    Report Status 04/11/2016 FINAL  Final  MRSA PCR Screening     Status: None   Collection Time: 04/10/16  4:42 AM  Result Value Ref Range Status   MRSA by PCR NEGATIVE NEGATIVE Final    Comment:        The GeneXpert MRSA Assay (FDA approved for NASAL specimens only), is one component of a comprehensive MRSA colonization surveillance program. It is not intended to diagnose MRSA infection nor to guide or monitor treatment for MRSA infections.     Radiology Reports Dg Chest 2 View  Result Date: 04/10/2016 CLINICAL DATA:  31 y/o  M; chest complaints and leukocytosis. EXAM: CHEST  2 VIEW COMPARISON:  None. FINDINGS: Normal heart size. Prominent pulmonary markings, likely bronchitic changes. Opacity within the posterior costal diaphragmatic sulcus and in the retrosternal space on the lateral view. No pleural effusion or pneumothorax. Bones are unremarkable. IMPRESSION: Prominent pulmonary markings, likely bronchitic changes. Opacities within the retrosternal space and posterior costodiaphragmatic angle on the lateral view are suspicious for areas of pneumonia. The retrosternal focus is centrally lucent and may represent a pulmonary abscess/septic embolus. These results were called by telephone at the time of interpretation on 04/10/2016 at 2:24 am to Dr. Paula Libra , who verbally acknowledged these results. Electronically Signed   By: Mitzi Hansen M.D.   On: 04/10/2016 02:21   Ct Chest W Contrast  Result Date: 04/10/2016 CLINICAL DATA:  Fever EXAM: CT CHEST  WITH CONTRAST TECHNIQUE: Multidetector CT imaging of the chest was performed during intravenous contrast administration. CONTRAST:  75mL ISOVUE-300 IOPAMIDOL (ISOVUE-300) INJECTION 61% COMPARISON:  Chest radiograph April 10, 2016 FINDINGS: Cardiovascular: There is no thoracic aortic aneurysm or dissection. Visualized great vessels appear unremarkable. There is mild atherosclerotic calcification in the aorta. The pericardium is not appreciably thickened. No major vessel pulmonary embolus is demonstrated. Mediastinum/Nodes: Visualized thyroid appears normal. There is a sub- carinal lymph node measuring 1.7 x 1.5 cm. There is a right hilar lymph node measuring 1.2 x 0.9 cm. Lungs/Pleura: There is airspace consolidation in both lower lobes, more severe on the right than on the left. There is a small focus of cavitation within the posterior segment right lower lobe consolidation. There are several small cavities in area of consolidation in the anterior segment of the right upper lobe. On axial slice 79 series 8, there is a nodular opacity in the anterior segment of the right upper lobe measuring 6 x 6 mm. On axial slice 78 series 8, there is  a knee 5 x 4 mm nodular opacity in the posterior segment of the right upper lobe. There is no appreciable pleural effusion or pleural thickening. Upper Abdomen: There is peripancreatic fluid with prominence of the pancreas, findings indicative of a degree of acute pancreatitis. There is no well-defined pancreatic mass or pseudocyst in the regions which are visualized. Visualized upper abdomen otherwise appears unremarkable. Musculoskeletal: There are no blastic or lytic bone lesions. IMPRESSION: Multifocal pneumonia. Small areas of apparent cavitation noted in the right upper lobe and right lower lobe regions of consolidation. Gas-forming bacteria or granulomatous etiologies for these areas of pneumonia must be of concern. Mildly enlarged lymph nodes as noted above. These lymph  nodes may well have reactive etiology given the areas of multifocal pneumonia. Incomplete visualization of the pancreas with evidence concerning for acute pancreatitis in the regions of the pancreas which are visualized. This finding may warrant CT abdomen and pelvis following oral and intravenous contrast to better delineate the pancreas and establish the extent of inflammation in this region. Mild atherosclerotic calcification in the aorta. These results will be called to the ordering clinician or representative by the Radiologist Assistant, and communication documented in the PACS or zVision Dashboard. Electronically Signed   By: Bretta Bang III M.D.   On: 04/10/2016 15:20   Ct Extrem Up Entire Arm L W/cm  Result Date: 04/10/2016 CLINICAL DATA:  Left upper extremity cellulitis. IV drug use with redness, pain and swelling for several days. Elevated white blood cell count. Question abscess. EXAM: CT OF THE UPPER LEFT EXTREMITY WITH CONTRAST TECHNIQUE: Multidetector CT imaging of the upper left extremity was performed according to the standard protocol following intravenous contrast administration. COMPARISON:  None. CONTRAST:  ISOVUE-300 IOPAMIDOL (ISOVUE-300) INJECTION 61% FINDINGS: Bones/Joint/Cartilage No fracture, periosteal reaction or bony destructive change. No evidence of elbow or shoulder joint effusion. There is a bone island in the scapula. Ligaments Suboptimally assessed by CT. Muscles and Tendons No evidence of intramuscular fluid collection. Edema suspected in the lateral biceps musculature. Soft tissues Diffuse subcutaneous edema and skin thickening of the left upper extremity from the mid humerus distally. This appears most prominent about the distal humerus, elbow and forearm. Tiny skin defect about the lateral aspect of the mid forearm. No tracking soft tissue air. No peripherally enhancing collection to suggest abscess. Enlarged epitrochlear and axillary nodes are likely reactive.  There is occlusive thrombosis throughout the basilic vein in the forearm and upper arm. Thrombus is also suspected of the cephalic vein in the forearm. Incidentally imaged paranasal sinus inflammation in the maxillary sinuses. IMPRESSION: 1. Diffuse cellulitis of the upper extremity without evidence of focal abscess. Tiny skin defect about the mid forearm, no tracking soft tissue air. 2. Edema within biceps musculature without gross intramuscular collection. 3. Thrombosis of the basilic and cephalic veins. 4. Epitrochlear and axillary lymph nodes are likely reactive. Electronically Signed   By: Rubye Oaks M.D.   On: 04/10/2016 01:59    Lab Data:  CBC:  Recent Labs Lab 04/09/16 2220 04/10/16 0840 04/11/16 0523  WBC 25.4* 19.2* 18.1*  NEUTROABS 21.8* 16.3*  --   HGB 12.4* 11.0* 10.3*  HCT 35.4* 32.8* 30.6*  MCV 86.1 86.8 86.7  PLT 277 215 234   Basic Metabolic Panel:  Recent Labs Lab 04/09/16 2220 04/10/16 0310 04/10/16 0840 04/11/16 0523  NA 122*  122* 129* 137 136  K 3.6  3.5 3.3* 3.5 3.2*  CL 90*  89* 98* 101 106  CO2  23  24 23 29 23   GLUCOSE 143*  143* 122* 119* 106*  BUN 16  16 12 10 7   CREATININE 0.84  0.78 0.73 0.70 0.58*  CALCIUM 8.2*  8.1* 7.4* 7.0* 7.7*   GFR: Estimated Creatinine Clearance: 137.3 mL/min (by C-G formula based on SCr of 0.58 mg/dL (L)). Liver Function Tests:  Recent Labs Lab 04/09/16 2220 04/10/16 0840  AST 70* 47*  ALT 40 32  ALKPHOS 73 91  BILITOT 0.9 1.1  PROT 6.5 7.3  ALBUMIN 2.7* 2.2*    Recent Labs Lab 04/10/16 1554  LIPASE 15   No results for input(s): AMMONIA in the last 168 hours. Coagulation Profile:  Recent Labs Lab 04/10/16 0840  INR 1.24   Cardiac Enzymes: No results for input(s): CKTOTAL, CKMB, CKMBINDEX, TROPONINI in the last 168 hours. BNP (last 3 results) No results for input(s): PROBNP in the last 8760 hours. HbA1C: No results for input(s): HGBA1C in the last 72 hours. CBG: No results for  input(s): GLUCAP in the last 168 hours. Lipid Profile: No results for input(s): CHOL, HDL, LDLCALC, TRIG, CHOLHDL, LDLDIRECT in the last 72 hours. Thyroid Function Tests: No results for input(s): TSH, T4TOTAL, FREET4, T3FREE, THYROIDAB in the last 72 hours. Anemia Panel: No results for input(s): VITAMINB12, FOLATE, FERRITIN, TIBC, IRON, RETICCTPCT in the last 72 hours. Urine analysis:    Component Value Date/Time   COLORURINE AMBER (A) 04/10/2016 0001   APPEARANCEUR CLOUDY (A) 04/10/2016 0001   LABSPEC 1.016 04/10/2016 0001   PHURINE 6.0 04/10/2016 0001   GLUCOSEU NEGATIVE 04/10/2016 0001   HGBUR LARGE (A) 04/10/2016 0001   BILIRUBINUR NEGATIVE 04/10/2016 0001   KETONESUR NEGATIVE 04/10/2016 0001   PROTEINUR 100 (A) 04/10/2016 0001   NITRITE NEGATIVE 04/10/2016 0001   LEUKOCYTESUR NEGATIVE 04/10/2016 0001     RAI,RIPUDEEP M.D. [facility] 04/12/2016, 1:14 PM  Pager: [phone] Between 7am to 7pm - call Pager - [phone]  After 7pm go to [email or web address] - password [email or web address]  Call night coverage person covering after 7pm

## 2016-04-13 LAB — CBC
HEMATOCRIT: 35.5 % — AB (ref 39.0–52.0)
HEMOGLOBIN: 12 g/dL — AB (ref 13.0–17.0)
MCH: 29.6 pg (ref 26.0–34.0)
MCHC: 33.8 g/dL (ref 30.0–36.0)
MCV: 87.4 fL (ref 78.0–100.0)
Platelets: 344 10*3/uL (ref 150–400)
RBC: 4.06 MIL/uL — ABNORMAL LOW (ref 4.22–5.81)
RDW: 15.3 % (ref 11.5–15.5)
WBC: 15.2 10*3/uL — ABNORMAL HIGH (ref 4.0–10.5)

## 2016-04-13 LAB — BASIC METABOLIC PANEL
Anion gap: 9 (ref 5–15)
BUN: 6 mg/dL (ref 6–20)
CHLORIDE: 106 mmol/L (ref 101–111)
CO2: 22 mmol/L (ref 22–32)
CREATININE: 0.58 mg/dL — AB (ref 0.61–1.24)
Calcium: 8.4 mg/dL — ABNORMAL LOW (ref 8.9–10.3)
GFR calc non Af Amer: >60 mL/min (ref >60)
Glucose, Bld: 79 mg/dL (ref 65–99)
Potassium: 4.4 mmol/L (ref 3.5–5.1)
Sodium: 137 mmol/L (ref 135–145)

## 2016-04-13 MED ORDER — MORPHINE SULFATE (PF) 2 MG/ML IV SOLN
2.0000 mg | INTRAVENOUS | Status: AC
  Administered 2016-04-13 – 2016-04-14 (×7): 2 mg via INTRAVENOUS
  Filled 2016-04-13 (×8): qty 1

## 2016-04-13 NOTE — Progress Notes (Signed)
[facility] has been requested to perform a transesophageal echocardiogram on 04/14/16 for bacteremia .  After careful review of history and examination, the risks and benefits of transesophageal echocardiogram have been explained including risks of esophageal damage, perforation (1:10,000 risk), bleeding, pharyngeal hematoma as well as other potential complications associated with conscious sedation including aspiration, arrhythmia, respiratory failure and death. Alternatives to treatment were discussed, questions were answered. Patient is willing to proceed.  TEE - Dr. Rennis GoldenHilty  @ 14:00 . NPO after midnight. Meds with sips ok   Douglas IshikawaErin E Smith, NP 04/13/2016 1:50 PM

## 2016-04-13 NOTE — Progress Notes (Signed)
[facility]                                                                              Patient Demographics  Douglas Parrish, is a 31 y.o. male, DOB - 1985-07-20, ZOX:[id number]  Admit date - 04/09/2016   Admitting Physician Eduard Clos, MD  Outpatient Primary MD for the patient is No PCP Per Patient  Outpatient specialists:   LOS - 3  days    Chief Complaint  Patient presents with  . Abscess       Brief summary   Patient is a 31 year old male with history of IV drug use presented with worsening pain on the left forearm in the last 2-3 days. Patient admitted to have been injecting drugs in the same arm, subjective fevers and chills. CT scan of the left upper extremity showed a diffuse cellulitis with DVT. Patient was started on empiric antibiotics after blood cultures were obtained.   Assessment & Plan    Principal Problem:  Strep pyogenes bacteremia sepsis (HCC)From the left upper extremity cellulitis in the setting of IV drug use - Blood cultures positive for strep pyogenes - CT of the left arm showed diffuse cellulitis without any focal abscess, edema within the biceps musculature without gross intramuscular collection, thrombosis of the basilic and cephalic veins - ortho consulted, d/w Dr Xu-> currently no need for operative management, continue IV antibiotics - Keep left upper arm elevated - Continue IV clindamycin and penicillin, ID following - 2-D echo showed no vegetations, TEE planned for 1/23 - Repeat blood cultures in process  Active Problems:  Superficial venous thrombosis (HCC) LUE -  CT of the left arm showed thrombosis of the basilic and cephalic veins - Doppler US neg for DVT, superficial thrombosis in the left cephalic and basilic veins  Multifocal cavitary pneumonia:  - CT chest showed Multifocal pneumonia. Small areas of apparent cavitation noted in the right upper lobe and right lower lobe regions of consolidation. Likely due  to septic emboli -Previously was on IV vancomycin and Zosyn, now on IV clindamycin and penicillin, follow ID recommendations    Hyponatremia with lactic acidosis - Likely due to #1, improving with IV fluid hydration  Polysubstance abuse,  IV heroin abuse  - Patient counseled on drugs cessation  - Per patient he uses IV heroin half gram 3 times a day, injects . Continue scheduled morphine today again for heroin withdrawals. Cannot give clonidine due to low BP.  - States willing to quit, social work consult placed  Code Status:full  DVT Prophylaxis:  Lovenox  Family Communication: Discussed in detail with the patient, all imaging results, lab results explained to the patient  Disposition Plan:   Time Spent in minutes  25 minutes  Procedures:  CT left arm  Consultants:   Orthopedics  ID  Antimicrobials:   IV penicillin G 1/19  IV clindamycin 1/19    Medications  Scheduled Meds: . enoxaparin (LOVENOX) injection  40 mg Subcutaneous Q24H  .  morphine injection  2 mg Intravenous Q3H  . pencillin G potassium IV  4 Million Units Intravenous Q4H   Continuous Infusions: . sodium  chloride 125 mL/hr at 04/12/16 2000   PRN Meds:.acetaminophen **OR** acetaminophen, morphine injection, ondansetron **OR** ondansetron (ZOFRAN) IV   Antibiotics   Anti-infectives    Start     Dose/Rate Route Frequency Ordered Stop   04/11/16 0000  penicillin G potassium 4 Million Units in dextrose 5 % 250 mL IVPB     4 Million Units 250 mL/hr over 60 Minutes Intravenous Every 4 hours 04/10/16 1944     04/10/16 2200  clindamycin (CLEOCIN) IVPB 600 mg  Status:  Discontinued     600 mg 100 mL/hr over 30 Minutes Intravenous Every 8 hours 04/10/16 1944 04/13/16 1026   04/10/16 0800  vancomycin (VANCOCIN) 1,250 mg in sodium chloride 0.9 % 250 mL IVPB  Status:  Discontinued     1,250 mg 166.7 mL/hr over 90 Minutes Intravenous Every 12 hours 04/10/16 0553 04/10/16 1944   04/10/16 0630   piperacillin-tazobactam (ZOSYN) IVPB 3.375 g  Status:  Discontinued     3.375 g 12.5 mL/hr over 240 Minutes Intravenous Every 8 hours 04/10/16 0553 04/10/16 1944   04/10/16 0600  piperacillin-tazobactam (ZOSYN) IVPB 3.375 g  Status:  Discontinued     3.375 g 100 mL/hr over 30 Minutes Intravenous  Once 04/10/16 0547 04/10/16 0602   04/10/16 0600  vancomycin (VANCOCIN) IVPB 1000 mg/200 mL premix  Status:  Discontinued     1,000 mg 200 mL/hr over 60 Minutes Intravenous  Once 04/10/16 0547 04/10/16 0602   04/09/16 2300  piperacillin-tazobactam (ZOSYN) IVPB 3.375 g     3.375 g 100 mL/hr over 30 Minutes Intravenous  Once 04/09/16 2245 04/09/16 2334   04/09/16 2300  vancomycin (VANCOCIN) IVPB 1000 mg/200 mL premix     1,000 mg 200 mL/hr over 60 Minutes Intravenous  Once 04/09/16 2245 04/10/16 0040        Subjective:   Douglas Parrish was seen and examined today.  Left arm improving, no fevers or chills. Withdrawal symptoms better after starting morphine. Pain in the left arm is also controlled with morphine. Patient denies dizziness, chest pain, shortness of breath, new weakness, numbess, tingling.   Objective:   Vitals:   04/12/16 2319 04/13/16 0332 04/13/16 0821 04/13/16 1100  BP: (!) 105/59 102/65 101/65 115/76  Pulse: 86  83 85  Resp: (!) 23  13   Temp: 100 F (37.8 C) 98.8 F (37.1 C) 98.4 F (36.9 C) 98 F (36.7 C)  TempSrc: Oral Oral Oral Oral  SpO2: 96%  94% 99%  Weight:      Height:        Intake/Output Summary (Last 24 hours) at 04/13/16 1303 Last data filed at 04/13/16 1100  Gross per 24 hour  Intake          3829.17 ml  Output             6050 ml  Net         -2220.83 ml     Wt Readings from Last 3 Encounters:  04/10/16 70.6 kg (155 lb 10.3 oz)     Exam  General: Alert and oriented x 3, NAD,   HEENT:   Neck: Supple, no JVD  Cardiovascular: S1 S2 clear, RRR  Respiratory: CTAB  Gastrointestinal: Soft, nontender, nondistended, + bowel sounds  Ext:  no cyanosis clubbing.cellulitis improving  Neuro: no new deficits   Skin: Cellulitis improving  Psych: Normal affect and demeanor, alert and oriented x3    Data Reviewed:  I have personally reviewed following labs and imaging studies  Micro Results Recent Results (from the past 240 hour(s))  Blood culture (routine x 2)     Status: Abnormal   Collection Time: 04/09/16 10:20 PM  Result Value Ref Range Status   Specimen Description BLOOD RIGHT ANTECUBITAL  Final   Special Requests   Final    BOTTLES DRAWN AEROBIC AND ANAEROBIC AER 5cc ANA 5cc   Culture  Setup Time   Final    GRAM POSITIVE COCCI IN CHAINS IN BOTH AEROBIC AND ANAEROBIC BOTTLES CRITICAL RESULT CALLED TO, READ BACK BY AND VERIFIED WITH: Chesley Mires, RPh 04/10/16 AT 1845 BY J. FUDESCO    Culture (A)  Final    GROUP A STREP (S.PYOGENES) ISOLATED HEALTH DEPARTMENT NOTIFIED Performed at [facility], [address] [place], [place] [address]    Report Status 04/12/2016 FINAL  Final   Organism ID, Bacteria GROUP A STREP (S.PYOGENES) ISOLATED  Final      Susceptibility   Group a strep (s.pyogenes) isolated - MIC*    ERYTHROMYCIN >=8 RESISTANT Resistant     TETRACYCLINE >=16 RESISTANT Resistant     VANCOMYCIN <=0.12 SENSITIVE Sensitive     CLINDAMYCIN >=1 RESISTANT Resistant     Inducible Clindamycin NEGATIVE Sensitive     PENICILLIN Value in next row Sensitive      SENSITIVE<=0.06    CEFTRIAXONE Value in next row Sensitive      SENSITIVE<=0.12    * GROUP A STREP (S.PYOGENES) ISOLATED  Blood Culture ID Panel (Reflexed)     Status: Abnormal   Collection Time: 04/09/16 10:20 PM  Result Value Ref Range Status   Enterococcus species NOT DETECTED NOT DETECTED Final   Listeria monocytogenes NOT DETECTED NOT DETECTED Final   Staphylococcus species NOT DETECTED NOT DETECTED Final   Staphylococcus aureus NOT DETECTED NOT DETECTED Final   Streptococcus species DETECTED (A) NOT DETECTED Final    Comment: CRITICAL RESULT  CALLED TO, READ BACK BY AND VERIFIED WITH: R. CLARK, RPh, 04/10/16 at 1845 BY J. FUDESCO    Streptococcus agalactiae NOT DETECTED NOT DETECTED Final   Streptococcus pneumoniae NOT DETECTED NOT DETECTED Final   Streptococcus pyogenes DETECTED (A) NOT DETECTED Final    Comment: CRITICAL RESULT CALLED TO, READ BACK BY AND VERIFIED WITH:  R CLARK, RPh, 04/10/16 at 1845 BY J. FUDESCO    Acinetobacter baumannii NOT DETECTED NOT DETECTED Final   Enterobacteriaceae species NOT DETECTED NOT DETECTED Final   Enterobacter cloacae complex NOT DETECTED NOT DETECTED Final   Escherichia coli NOT DETECTED NOT DETECTED Final   Klebsiella oxytoca NOT DETECTED NOT DETECTED Final   Klebsiella pneumoniae NOT DETECTED NOT DETECTED Final   Proteus species NOT DETECTED NOT DETECTED Final   Serratia marcescens NOT DETECTED NOT DETECTED Final   Haemophilus influenzae NOT DETECTED NOT DETECTED Final   Neisseria meningitidis NOT DETECTED NOT DETECTED Final   Pseudomonas aeruginosa NOT DETECTED NOT DETECTED Final   Candida albicans NOT DETECTED NOT DETECTED Final   Candida glabrata NOT DETECTED NOT DETECTED Final   Candida krusei NOT DETECTED NOT DETECTED Final   Candida parapsilosis NOT DETECTED NOT DETECTED Final   Candida tropicalis NOT DETECTED NOT DETECTED Final    Comment: Performed at [facility], [address], [place], [place] [address]  Blood culture (routine x 2)     Status: Abnormal   Collection Time: 04/09/16 10:31 PM  Result Value Ref Range Status   Specimen Description BLOOD RIGHT FOREARM  Final   Special Requests   Final  BOTTLES DRAWN AEROBIC AND ANAEROBIC AER 5cc ANA 5cc   Culture  Setup Time   Final    GRAM POSITIVE COCCI IN CHAINS IN BOTH AEROBIC AND ANAEROBIC BOTTLES CRITICAL VALUE NOTED.  VALUE IS CONSISTENT WITH PREVIOUSLY REPORTED AND CALLED VALUE.    Culture (A)  Final    GROUP A STREP (S.PYOGENES) ISOLATED SUSCEPTIBILITIES PERFORMED ON PREVIOUS CULTURE WITHIN THE LAST 5  DAYS. HEALTH DEPARTMENT NOTIFIED Performed at [facility], [address], [place], [place] [address]    Report Status 04/12/2016 FINAL  Final  Urine culture     Status: None   Collection Time: 04/10/16 12:01 AM  Result Value Ref Range Status   Specimen Description URINE, RANDOM  Final   Special Requests NONE  Final   Culture   Final    NO GROWTH Performed at [facility], [address], [place], [place] [address]    Report Status 04/11/2016 FINAL  Final  MRSA PCR Screening     Status: None   Collection Time: 04/10/16  4:42 AM  Result Value Ref Range Status   MRSA by PCR NEGATIVE NEGATIVE Final    Comment:        The GeneXpert MRSA Assay (FDA approved for NASAL specimens only), is one component of a comprehensive MRSA colonization surveillance program. It is not intended to diagnose MRSA infection nor to guide or monitor treatment for MRSA infections.     Radiology Reports Dg Chest 2 View  Result Date: 04/10/2016 CLINICAL DATA:  31 y/o  M; chest complaints and leukocytosis. EXAM: CHEST  2 VIEW COMPARISON:  None. FINDINGS: Normal heart size. Prominent pulmonary markings, likely bronchitic changes. Opacity within the posterior costal diaphragmatic sulcus and in the retrosternal space on the lateral view. No pleural effusion or pneumothorax. Bones are unremarkable. IMPRESSION: Prominent pulmonary markings, likely bronchitic changes. Opacities within the retrosternal space and posterior costodiaphragmatic angle on the lateral view are suspicious for areas of pneumonia. The retrosternal focus is centrally lucent and may represent a pulmonary abscess/septic embolus. These results were called by telephone at the time of interpretation on 04/10/2016 at 2:24 am to Dr. Paula Libra , who verbally acknowledged these results. Electronically Signed   By: Mitzi Hansen M.D.   On: 04/10/2016 02:21   Ct Chest W Contrast  Result Date: 04/10/2016 CLINICAL DATA:  Fever  EXAM: CT CHEST WITH CONTRAST TECHNIQUE: Multidetector CT imaging of the chest was performed during intravenous contrast administration. CONTRAST:  75mL ISOVUE-300 IOPAMIDOL (ISOVUE-300) INJECTION 61% COMPARISON:  Chest radiograph April 10, 2016 FINDINGS: Cardiovascular: There is no thoracic aortic aneurysm or dissection. Visualized great vessels appear unremarkable. There is mild atherosclerotic calcification in the aorta. The pericardium is not appreciably thickened. No major vessel pulmonary embolus is demonstrated. Mediastinum/Nodes: Visualized thyroid appears normal. There is a sub- carinal lymph node measuring 1.7 x 1.5 cm. There is a right hilar lymph node measuring 1.2 x 0.9 cm. Lungs/Pleura: There is airspace consolidation in both lower lobes, more severe on the right than on the left. There is a small focus of cavitation within the posterior segment right lower lobe consolidation. There are several small cavities in area of consolidation in the anterior segment of the right upper lobe. On axial slice 79 series 8, there is a nodular opacity in the anterior segment of the right upper lobe measuring 6 x 6 mm. On axial slice 78 series 8, there is a knee 5 x 4 mm nodular opacity in the posterior  segment of the right upper lobe. There is no appreciable pleural effusion or pleural thickening. Upper Abdomen: There is peripancreatic fluid with prominence of the pancreas, findings indicative of a degree of acute pancreatitis. There is no well-defined pancreatic mass or pseudocyst in the regions which are visualized. Visualized upper abdomen otherwise appears unremarkable. Musculoskeletal: There are no blastic or lytic bone lesions. IMPRESSION: Multifocal pneumonia. Small areas of apparent cavitation noted in the right upper lobe and right lower lobe regions of consolidation. Gas-forming bacteria or granulomatous etiologies for these areas of pneumonia must be of concern. Mildly enlarged lymph nodes as noted above.  These lymph nodes may well have reactive etiology given the areas of multifocal pneumonia. Incomplete visualization of the pancreas with evidence concerning for acute pancreatitis in the regions of the pancreas which are visualized. This finding may warrant CT abdomen and pelvis following oral and intravenous contrast to better delineate the pancreas and establish the extent of inflammation in this region. Mild atherosclerotic calcification in the aorta. These results will be called to the ordering clinician or representative by the Radiologist Assistant, and communication documented in the PACS or zVision Dashboard. Electronically Signed   By: Bretta Bang III M.D.   On: 04/10/2016 15:20   Ct Extrem Up Entire Arm L W/cm  Result Date: 04/10/2016 CLINICAL DATA:  Left upper extremity cellulitis. IV drug use with redness, pain and swelling for several days. Elevated white blood cell count. Question abscess. EXAM: CT OF THE UPPER LEFT EXTREMITY WITH CONTRAST TECHNIQUE: Multidetector CT imaging of the upper left extremity was performed according to the standard protocol following intravenous contrast administration. COMPARISON:  None. CONTRAST:  ISOVUE-300 IOPAMIDOL (ISOVUE-300) INJECTION 61% FINDINGS: Bones/Joint/Cartilage No fracture, periosteal reaction or bony destructive change. No evidence of elbow or shoulder joint effusion. There is a bone island in the scapula. Ligaments Suboptimally assessed by CT. Muscles and Tendons No evidence of intramuscular fluid collection. Edema suspected in the lateral biceps musculature. Soft tissues Diffuse subcutaneous edema and skin thickening of the left upper extremity from the mid humerus distally. This appears most prominent about the distal humerus, elbow and forearm. Tiny skin defect about the lateral aspect of the mid forearm. No tracking soft tissue air. No peripherally enhancing collection to suggest abscess. Enlarged epitrochlear and axillary nodes are  likely reactive. There is occlusive thrombosis throughout the basilic vein in the forearm and upper arm. Thrombus is also suspected of the cephalic vein in the forearm. Incidentally imaged paranasal sinus inflammation in the maxillary sinuses. IMPRESSION: 1. Diffuse cellulitis of the upper extremity without evidence of focal abscess. Tiny skin defect about the mid forearm, no tracking soft tissue air. 2. Edema within biceps musculature without gross intramuscular collection. 3. Thrombosis of the basilic and cephalic veins. 4. Epitrochlear and axillary lymph nodes are likely reactive. Electronically Signed   By: Rubye Oaks M.D.   On: 04/10/2016 01:59    Lab Data:  CBC:  Recent Labs Lab 04/09/16 2220 04/10/16 0840 04/11/16 0523 04/13/16 0302  WBC 25.4* 19.2* 18.1* 15.2*  NEUTROABS 21.8* 16.3*  --   --   HGB 12.4* 11.0* 10.3* 12.0*  HCT 35.4* 32.8* 30.6* 35.5*  MCV 86.1 86.8 86.7 87.4  PLT 277 215 234 344   Basic Metabolic Panel:  Recent Labs Lab 04/09/16 2220 04/10/16 0310 04/10/16 0840 04/11/16 0523 04/13/16 0302  NA 122*  122* 129* 137 136 137  K 3.6  3.5 3.3* 3.5 3.2* 4.4  CL 90*  89* 98* 101  106 106  CO2 23  24 23 29 23 22   GLUCOSE 143*  143* 122* 119* 106* 79  BUN 16  16 12 10 7 6   CREATININE 0.84  0.78 0.73 0.70 0.58* 0.58*  CALCIUM 8.2*  8.1* 7.4* 7.0* 7.7* 8.4*   GFR: Estimated Creatinine Clearance: 137.3 mL/min (by C-G formula based on SCr of 0.58 mg/dL (L)). Liver Function Tests:  Recent Labs Lab 04/09/16 2220 04/10/16 0840  AST 70* 47*  ALT 40 32  ALKPHOS 73 91  BILITOT 0.9 1.1  PROT 6.5 7.3  ALBUMIN 2.7* 2.2*    Recent Labs Lab 04/10/16 1554  LIPASE 15   No results for input(s): AMMONIA in the last 168 hours. Coagulation Profile:  Recent Labs Lab 04/10/16 0840  INR 1.24   Cardiac Enzymes: No results for input(s): CKTOTAL, CKMB, CKMBINDEX, TROPONINI in the last 168 hours. BNP (last 3 results) No results for input(s): PROBNP  in the last 8760 hours. HbA1C: No results for input(s): HGBA1C in the last 72 hours. CBG: No results for input(s): GLUCAP in the last 168 hours. Lipid Profile: No results for input(s): CHOL, HDL, LDLCALC, TRIG, CHOLHDL, LDLDIRECT in the last 72 hours. Thyroid Function Tests: No results for input(s): TSH, T4TOTAL, FREET4, T3FREE, THYROIDAB in the last 72 hours. Anemia Panel: No results for input(s): VITAMINB12, FOLATE, FERRITIN, TIBC, IRON, RETICCTPCT in the last 72 hours. Urine analysis:    Component Value Date/Time   COLORURINE AMBER (A) 04/10/2016 0001   APPEARANCEUR CLOUDY (A) 04/10/2016 0001   LABSPEC 1.016 04/10/2016 0001   PHURINE 6.0 04/10/2016 0001   GLUCOSEU NEGATIVE 04/10/2016 0001   HGBUR LARGE (A) 04/10/2016 0001   BILIRUBINUR NEGATIVE 04/10/2016 0001   KETONESUR NEGATIVE 04/10/2016 0001   PROTEINUR 100 (A) 04/10/2016 0001   NITRITE NEGATIVE 04/10/2016 0001   LEUKOCYTESUR NEGATIVE 04/10/2016 0001     RAI,RIPUDEEP M.D. [facility] 04/13/2016, 1:03 PM  Pager: [phone] Between 7am to 7pm - call Pager - [phone]  After 7pm go to [email or web address] - password [email or web address]  Call night coverage person covering after 7pm

## 2016-04-14 ENCOUNTER — Inpatient Hospital Stay (HOSPITAL_COMMUNITY): Payer: Self-pay | Admitting: Anesthesiology

## 2016-04-14 ENCOUNTER — Inpatient Hospital Stay (HOSPITAL_COMMUNITY): Payer: Self-pay

## 2016-04-14 ENCOUNTER — Encounter (HOSPITAL_COMMUNITY)
Admission: EM | Disposition: A | Discharge status: Home / Self Care | Payer: Self-pay | Source: Home / Self Care | Attending: Internal Medicine

## 2016-04-14 ENCOUNTER — Encounter (HOSPITAL_COMMUNITY): Payer: Self-pay

## 2016-04-14 DIAGNOSIS — R7881 Bacteremia: Secondary | ICD-10-CM

## 2016-04-14 HISTORY — PX: TEE WITHOUT CARDIOVERSION: SHX5443

## 2016-04-14 LAB — CBC
HEMATOCRIT: 35.5 % — AB (ref 39.0–52.0)
HEMOGLOBIN: 12.2 g/dL — AB (ref 13.0–17.0)
MCH: 30 pg (ref 26.0–34.0)
MCHC: 34.4 g/dL (ref 30.0–36.0)
MCV: 87.2 fL (ref 78.0–100.0)
Platelets: 481 10*3/uL — ABNORMAL HIGH (ref 150–400)
RBC: 4.07 MIL/uL — AB (ref 4.22–5.81)
RDW: 15.4 % (ref 11.5–15.5)
WBC: 15.9 10*3/uL — ABNORMAL HIGH (ref 4.0–10.5)

## 2016-04-14 LAB — BASIC METABOLIC PANEL
Anion gap: 8 (ref 5–15)
BUN: 9 mg/dL (ref 6–20)
CHLORIDE: 104 mmol/L (ref 101–111)
CO2: 23 mmol/L (ref 22–32)
Calcium: 8.6 mg/dL — ABNORMAL LOW (ref 8.9–10.3)
Creatinine, Ser: 0.6 mg/dL — ABNORMAL LOW (ref 0.61–1.24)
GFR calc Af Amer: >60 mL/min (ref >60)
GFR calc non Af Amer: >60 mL/min (ref >60)
GLUCOSE: 90 mg/dL (ref 65–99)
Potassium: 4.4 mmol/L (ref 3.5–5.1)
Sodium: 135 mmol/L (ref 135–145)

## 2016-04-14 SURGERY — ECHOCARDIOGRAM, TRANSESOPHAGEAL
Anesthesia: Monitor Anesthesia Care

## 2016-04-14 MED ORDER — LIDOCAINE HCL (PF) 2 % IJ SOLN
INTRAMUSCULAR | Status: DC | PRN
  Administered 2016-04-14: 80 mg via INTRADERMAL

## 2016-04-14 MED ORDER — SODIUM CHLORIDE 0.9 % IV SOLN: INTRAVENOUS | Status: DC

## 2016-04-14 MED ORDER — BUTAMBEN-TETRACAINE-BENZOCAINE 2-2-14 % EX AERO
INHALATION_SPRAY | CUTANEOUS | Status: DC | PRN
  Administered 2016-04-14: 2 via TOPICAL

## 2016-04-14 MED ORDER — GLYCOPYRROLATE 0.2 MG/ML IJ SOLN
INTRAMUSCULAR | Status: DC | PRN
  Administered 2016-04-14: 0.2 mg via INTRAVENOUS

## 2016-04-14 MED ORDER — HYDROMORPHONE HCL 2 MG/ML IJ SOLN
1.0000 mg | INTRAMUSCULAR | Status: DC | PRN
  Administered 2016-04-14: 1 mg via INTRAVENOUS
  Administered 2016-04-15 (×3): 2 mg via INTRAVENOUS
  Filled 2016-04-14 (×4): qty 1

## 2016-04-14 MED ORDER — PROPOFOL 500 MG/50ML IV EMUL
INTRAVENOUS | Status: DC | PRN
  Administered 2016-04-14: 100 ug/kg/min via INTRAVENOUS
  Administered 2016-04-14: 50 ug/kg/min via INTRAVENOUS

## 2016-04-14 MED ORDER — SENNOSIDES-DOCUSATE SODIUM 8.6-50 MG PO TABS
1.0000 | ORAL_TABLET | Freq: Two times a day (BID) | ORAL | Status: DC
  Filled 2016-04-14 (×3): qty 1

## 2016-04-14 MED ORDER — POLYETHYLENE GLYCOL 3350 17 G PO PACK
17.0000 g | PACK | Freq: Every day | ORAL | Status: DC
  Filled 2016-04-14 (×2): qty 1

## 2016-04-14 MED ORDER — OXYCODONE-ACETAMINOPHEN 5-325 MG PO TABS
1.0000 | ORAL_TABLET | ORAL | Status: DC | PRN
  Administered 2016-04-14 – 2016-04-15 (×4): 2 via ORAL
  Filled 2016-04-14 (×4): qty 2

## 2016-04-14 NOTE — H&P (Signed)
    INTERVAL PROCEDURE H&P  History and Physical Interval Note:  04/14/2016 1:53 PM  Douglas Derrickonald Lukacs has presented today for their planned procedure. The various methods of treatment have been discussed with the patient and family. After consideration of risks, benefits and other options for treatment, the patient has consented to the procedure.  The patients' outpatient history has been reviewed, patient examined, and no change in status from most recent office note within the past 30 days. I have reviewed the patients' chart and labs and will proceed as planned. Questions were answered to the patient's satisfaction.   Douglas NoseKenneth C. Hilty, MD, [facility] Hospital[facility] Attending Cardiologist [facility]  Douglas NoseKenneth C Parrish 04/14/2016, 1:53 PM

## 2016-04-14 NOTE — Anesthesia Postprocedure Evaluation (Signed)
Anesthesia Post Note  Patient: Caitlin Hillmer  Procedure(s) Performed: Procedure(s) (LRB): TRANSESOPHAGEAL ECHOCARDIOGRAM (TEE) (N/A)  Patient location during evaluation: Endoscopy Anesthesia Type: MAC Level of consciousness: awake and alert Pain management: pain level controlled Vital Signs Assessment: post-procedure vital signs reviewed and stable Respiratory status: spontaneous breathing, nonlabored ventilation, respiratory function stable and patient connected to nasal cannula oxygen Cardiovascular status: stable and blood pressure returned to baseline Anesthetic complications: no       Last Vitals:  Vitals:   04/14/16 1437 04/14/16 1440  BP: (!) 148/65 (!) 148/65  Pulse: 99   Resp: 18   Temp:      Last Pain:  Vitals:   04/14/16 1603  TempSrc:   PainSc: 7                  Laraina Sulton,JAMES TERRILL

## 2016-04-14 NOTE — Transfer of Care (Addendum)
Immediate Anesthesia Transfer of Care Note  Patient: Douglas Parrish  Procedure(s) Performed: Procedure(s): TRANSESOPHAGEAL ECHOCARDIOGRAM (TEE) (N/A)  Patient Location: PACU  Anesthesia Type:MAC  Level of Consciousness: awake, alert , oriented and patient cooperative  Airway & Oxygen Therapy: Patient Spontanous Breathing and Patient connected to nasal cannula oxygen  Post-op Assessment: Report given to RN and Post -op Vital signs reviewed and stable  Post vital signs: Reviewed  Last Vitals: 119/58, 79,16,100% Vitals:   04/14/16 1339 04/14/16 1437  BP: 132/69   Pulse: 82 99  Resp: 15 18  Temp: 36.8 C     Last Pain:  Vitals:   04/14/16 1437  TempSrc: Oral  PainSc:       Patients Stated Pain Goal: 3 (15/18/34 3735)  Complications: No apparent anesthesia complications

## 2016-04-14 NOTE — CV Procedure (Signed)
    TRANSESOPHAGEAL ECHOCARDIOGRAM (TEE) NOTE  INDICATIONS: infective endocarditis  PROCEDURE:   Informed consent was obtained prior to the procedure. The risks, benefits and alternatives for the procedure were discussed and the patient comprehended these risks.  Risks include, but are not limited to, cough, sore throat, vomiting, nausea, somnolence, esophageal and stomach trauma or perforation, bleeding, low blood pressure, aspiration, pneumonia, infection, trauma to the teeth and death.    After a procedural time-out, the patient was given propofol per anesthesia for sedation.  The patient's heart rate, blood pressure, and oxygen saturation are monitored continuously during the procedure.The transesophageal probe was inserted in the esophagus and stomach without difficulty and multiple views were obtained.  The patient was kept under observation until the patient left the procedure room. The patient left the procedure room in stable condition.   Agitated microbubble saline contrast was administered.  COMPLICATIONS:    There were no immediate complications.  Findings:  1. LEFT VENTRICLE: The left ventricular wall thickness is normal.  The left ventricular cavity is normal in size. Wall motion is normal.  LVEF is 55-60%.  2. RIGHT VENTRICLE:  The right ventricle is normal in structure and function without any thrombus or masses.    3. LEFT ATRIUM:  The left atrium is normal in size without any thrombus or masses.  There is not spontaneous echo contrast ("smoke") in the left atrium consistent with a low flow state.  4. LEFT ATRIAL APPENDAGE:  The left atrial appendage is free of any thrombus or masses. The appendage has single lobes. Pulse doppler indicates high flow in the appendage.  5. ATRIAL SEPTUM:  The atrial septum appears intact and is free of thrombus and/or masses.  There is no evidence for interatrial shunting by color doppler and saline microbubble.  6. RIGHT ATRIUM:  The  right atrium is normal in size and function without any thrombus or masses.  7. MITRAL VALVE:  The mitral valve is normal in structure and function with trivial regurgitation.  There were no vegetations or stenosis.  8. AORTIC VALVE:  The aortic valve is trileaflet, normal in structure and function with no regurgitation.  There were no vegetations or stenosis  9. TRICUSPID VALVE:  The tricuspid valve is normal in structure and function with trivial regurgitation.  There were no vegetations or stenosis  10.  PULMONIC VALVE:  The pulmonic valve is normal in structure and function with no regurgitation.  There were no vegetations or stenosis.   11. AORTIC ARCH, ASCENDING AND DESCENDING AORTA:  There was no Myrtis Ser(Katz et. Al, 1992) atherosclerosis of the ascending aorta, aortic arch, or proximal descending aorta.  12. PULMONARY VEINS: Anomalous pulmonary venous return was not noted.  13. PERICARDIUM: The pericardium appeared normal and non-thickened.  There is no pericardial effusion.  IMPRESSION:   1. No evidence for endocarditis. 2. No LAA thrombus 3. Negative for PFO 4. No significant valvular heart disease 5. LVEF 55-60%  RECOMMENDATIONS:    1.  Normal echo. No evidence for infective endocarditis.  Time Spent Directly with the Patient:  30 minutes   Douglas NoseKenneth C. Alyiah Ulloa, MD, [facility]iskiFACC Attending Cardiologist [facility]  04/14/2016, 2:36 PM

## 2016-04-14 NOTE — Anesthesia Preprocedure Evaluation (Signed)
Anesthesia Evaluation  Patient identified by MRN, date of birth, ID band Patient awake    Reviewed: Allergy & Precautions, NPO status   Airway Mallampati: II       Dental  (+) Teeth Intact   Pulmonary Current Smoker,  Abn CxR noted possibly septic emboli vs pneumonia   + rhonchi        Cardiovascular  Rhythm:Regular Rate:Tachycardia     Neuro/Psych    GI/Hepatic (+)     substance abuse  ,   Endo/Other    Renal/GU      Musculoskeletal   Abdominal   Peds  Hematology  (+) Blood dyscrasia, ,   Anesthesia Other Findings   Reproductive/Obstetrics                             Anesthesia Physical Anesthesia Plan  ASA: III  Anesthesia Plan: MAC   Post-op Pain Management:    Induction: Intravenous  Airway Management Planned: Natural Airway  Additional Equipment:   Intra-op Plan:   Post-operative Plan: Possible Post-op intubation/ventilation  Informed Consent: I have reviewed the patients History and Physical, chart, labs and discussed the procedure including the risks, benefits and alternatives for the proposed anesthesia with the patient or authorized representative who has indicated his/her understanding and acceptance.   Dental advisory given  Plan Discussed with:   Anesthesia Plan Comments:         Anesthesia Quick Evaluation

## 2016-04-14 NOTE — Progress Notes (Signed)
[facility]                                                                              Patient Demographics  Douglas Parrish, is a 31 y.o. male, DOB - 09-Nov-1985, ZOX:[id number]  Admit date - 04/09/2016   Admitting Physician Eduard ClosArshad N Kakrakandy, MD  Outpatient Primary MD for the patient is No PCP Per Patient  Outpatient specialists:   LOS - 4  days    Chief Complaint  Patient presents with  . Abscess       Brief summary   Patient is a 31 year old male with history of IV drug use presented with worsening pain on the left forearm in the last 2-3 days. Patient admitted to have been injecting drugs in the same arm, subjective fevers and chills. CT scan of the left upper extremity showed a diffuse cellulitis with DVT. Patient was started on empiric antibiotics after blood cultures were obtained.   Assessment & Plan    Principal Problem:  Strep pyogenes bacteremia sepsis (HCC)From the left upper extremity cellulitis in the setting of IV drug use - Blood cultures positive for strep pyogenes - CT of the left arm showed diffuse cellulitis without any focal abscess, edema within the biceps musculature without gross intramuscular collection, thrombosis of the basilic and cephalic veins - ortho consulted, d/w Dr Xu-> currently no need for operative management, continue IV antibiotics - Keep left upper arm elevated - Continue IV clindamycin and penicillin, ID following - 2-D echo showed no vegetations, TEE planned today - Repeat blood cultures Negative so far  Active Problems:  Superficial venous thrombosis (HCC) LUE -  CT of the left arm showed thrombosis of the basilic and cephalic veins - Doppler US neg for DVT, superficial thrombosis in the left cephalic and basilic veins  Multifocal cavitary pneumonia:  - CT chest showed Multifocal pneumonia. Small areas of apparent cavitation noted in the right upper lobe and right lower lobe regions of consolidation. Likely  due to septic emboli -Previously was on IV vancomycin and Zosyn, now on IV clindamycin and penicillin, follow ID recommendations    Hyponatremia with lactic acidosis - Likely due to #1, improving with IV fluid hydration  Polysubstance abuse,  IV heroin abuse  - Patient counseled on drugs cessation  - Per patient he uses IV heroin half gram 3 times a day, injects, states willing to quit, social work consult placed - Pain is controlled, discontinued scheduled morphine, placed on oral pain medications and IV Dilaudid as needed for severe pain  Code Status:full  DVT Prophylaxis:  Lovenox  Family Communication: Discussed in detail with the patient, all imaging results, lab results explained to the patient  Disposition Plan:   Time Spent in minutes  25 minutes  Procedures:  CT left arm  Consultants:   Orthopedics  ID  Antimicrobials:   IV penicillin G 1/19  IV clindamycin 1/19    Medications  Scheduled Meds: . [MAR Hold] enoxaparin (LOVENOX) injection  40 mg Subcutaneous Q24H  . [MAR Hold] pencillin G potassium IV  4 Million Units Intravenous Q4H  . [MAR Hold] polyethylene glycol  17 g Oral  Daily  . [MAR Hold] senna-docusate  1 tablet Oral BID   Continuous Infusions: . sodium chloride 100 mL/hr at 04/14/16 0950  . sodium chloride Stopped (04/14/16 0950)   PRN Meds:.[MAR Hold] acetaminophen **OR** [MAR Hold] acetaminophen, [MAR Hold]  HYDROmorphone (DILAUDID) injection, [MAR Hold] ondansetron **OR** [MAR Hold] ondansetron (ZOFRAN) IV, [MAR Hold] oxyCODONE-acetaminophen   Antibiotics   Anti-infectives    Start     Dose/Rate Route Frequency Ordered Stop   04/11/16 0000  [MAR Hold]  penicillin G potassium 4 Million Units in dextrose 5 % 250 mL IVPB     (MAR Hold since 04/14/16 1325)   4 Million Units 250 mL/hr over 60 Minutes Intravenous Every 4 hours 04/10/16 1944     04/10/16 2200  clindamycin (CLEOCIN) IVPB 600 mg  Status:  Discontinued     600 mg 100 mL/hr over 30  Minutes Intravenous Every 8 hours 04/10/16 1944 04/13/16 1026   04/10/16 0800  vancomycin (VANCOCIN) 1,250 mg in sodium chloride 0.9 % 250 mL IVPB  Status:  Discontinued     1,250 mg 166.7 mL/hr over 90 Minutes Intravenous Every 12 hours 04/10/16 0553 04/10/16 1944   04/10/16 0630  piperacillin-tazobactam (ZOSYN) IVPB 3.375 g  Status:  Discontinued     3.375 g 12.5 mL/hr over 240 Minutes Intravenous Every 8 hours 04/10/16 0553 04/10/16 1944   04/10/16 0600  piperacillin-tazobactam (ZOSYN) IVPB 3.375 g  Status:  Discontinued     3.375 g 100 mL/hr over 30 Minutes Intravenous  Once 04/10/16 0547 04/10/16 0602   04/10/16 0600  vancomycin (VANCOCIN) IVPB 1000 mg/200 mL premix  Status:  Discontinued     1,000 mg 200 mL/hr over 60 Minutes Intravenous  Once 04/10/16 0547 04/10/16 0602   04/09/16 2300  piperacillin-tazobactam (ZOSYN) IVPB 3.375 g     3.375 g 100 mL/hr over 30 Minutes Intravenous  Once 04/09/16 2245 04/09/16 2334   04/09/16 2300  vancomycin (VANCOCIN) IVPB 1000 mg/200 mL premix     1,000 mg 200 mL/hr over 60 Minutes Intravenous  Once 04/09/16 2245 04/10/16 0040        Subjective:   Douglas Parrish was seen and examined today.  Left arm improving, no fevers or chills. Withdrawal symptoms Improved.  Patient denies dizziness, chest pain, shortness of breath, new weakness, numbess, tingling.   Objective:   Vitals:   04/13/16 1608 04/13/16 1940 04/14/16 0552 04/14/16 1339  BP: 119/64 114/75 122/70 132/69  Pulse: 95 91 77 82  Resp: 18 18 18 15   Temp: 98.3 F (36.8 C) 98.2 F (36.8 C) 98.2 F (36.8 C) 98.2 F (36.8 C)  TempSrc: Oral Oral Oral Oral  SpO2: 98% 99% 99% 99%  Weight:      Height:        Intake/Output Summary (Last 24 hours) at 04/14/16 1414 Last data filed at 04/14/16 1610  Gross per 24 hour  Intake          1997.92 ml  Output             2850 ml  Net          -852.08 ml     Wt Readings from Last 3 Encounters:  04/10/16 70.6 kg (155 lb 10.3 oz)      Exam  General: Alert and oriented x 3, NAD,   HEENT:   Neck: Supple, no JVD  Cardiovascular: S1 S2 clear, RRR  Respiratory: CTAB  Gastrointestinal: Soft, NT, ND, NBS  Ext: no cyanosis clubbing.cellulitis improving  Neuro: no new deficits   Skin: Cellulitis improving  Psych: Normal affect and demeanor, alert and oriented x3    Data Reviewed:  I have personally reviewed following labs and imaging studies  Micro Results Recent Results (from the past 240 hour(s))  Blood culture (routine x 2)     Status: Abnormal   Collection Time: 04/09/16 10:20 PM  Result Value Ref Range Status   Specimen Description BLOOD RIGHT ANTECUBITAL  Final   Special Requests   Final    BOTTLES DRAWN AEROBIC AND ANAEROBIC AER 5cc ANA 5cc   Culture  Setup Time   Final    GRAM POSITIVE COCCI IN CHAINS IN BOTH AEROBIC AND ANAEROBIC BOTTLES CRITICAL RESULT CALLED TO, READ BACK BY AND VERIFIED WITH: Chesley Mires, RPh 04/10/16 AT 1845 BY J. FUDESCO    Culture (A)  Final    GROUP A STREP (S.PYOGENES) ISOLATED HEALTH DEPARTMENT NOTIFIED Performed at [facility], [address] [place], [place] [address]    Report Status 04/12/2016 FINAL  Final   Organism ID, Bacteria GROUP A STREP (S.PYOGENES) ISOLATED  Final      Susceptibility   Group a strep (s.pyogenes) isolated - MIC*    ERYTHROMYCIN >=8 RESISTANT Resistant     TETRACYCLINE >=16 RESISTANT Resistant     VANCOMYCIN <=0.12 SENSITIVE Sensitive     CLINDAMYCIN >=1 RESISTANT Resistant     Inducible Clindamycin NEGATIVE Sensitive     PENICILLIN Value in next row Sensitive      SENSITIVE<=0.06    CEFTRIAXONE Value in next row Sensitive      SENSITIVE<=0.12    * GROUP A STREP (S.PYOGENES) ISOLATED  Blood Culture ID Panel (Reflexed)     Status: Abnormal   Collection Time: 04/09/16 10:20 PM  Result Value Ref Range Status   Enterococcus species NOT DETECTED NOT DETECTED Final   Listeria monocytogenes NOT DETECTED NOT DETECTED Final    Staphylococcus species NOT DETECTED NOT DETECTED Final   Staphylococcus aureus NOT DETECTED NOT DETECTED Final   Streptococcus species DETECTED (A) NOT DETECTED Final    Comment: CRITICAL RESULT CALLED TO, READ BACK BY AND VERIFIED WITH: R. CLARK, RPh, 04/10/16 at 1845 BY J. FUDESCO    Streptococcus agalactiae NOT DETECTED NOT DETECTED Final   Streptococcus pneumoniae NOT DETECTED NOT DETECTED Final   Streptococcus pyogenes DETECTED (A) NOT DETECTED Final    Comment: CRITICAL RESULT CALLED TO, READ BACK BY AND VERIFIED WITH:  R CLARK, RPh, 04/10/16 at 1845 BY J. FUDESCO    Acinetobacter baumannii NOT DETECTED NOT DETECTED Final   Enterobacteriaceae species NOT DETECTED NOT DETECTED Final   Enterobacter cloacae complex NOT DETECTED NOT DETECTED Final   Escherichia coli NOT DETECTED NOT DETECTED Final   Klebsiella oxytoca NOT DETECTED NOT DETECTED Final   Klebsiella pneumoniae NOT DETECTED NOT DETECTED Final   Proteus species NOT DETECTED NOT DETECTED Final   Serratia marcescens NOT DETECTED NOT DETECTED Final   Haemophilus influenzae NOT DETECTED NOT DETECTED Final   Neisseria meningitidis NOT DETECTED NOT DETECTED Final   Pseudomonas aeruginosa NOT DETECTED NOT DETECTED Final   Candida albicans NOT DETECTED NOT DETECTED Final   Candida glabrata NOT DETECTED NOT DETECTED Final   Candida krusei NOT DETECTED NOT DETECTED Final   Candida parapsilosis NOT DETECTED NOT DETECTED Final   Candida tropicalis NOT DETECTED NOT DETECTED Final    Comment: Performed at [facility], [address] [place], [place] [address]  Blood culture (routine x 2)  Status: Abnormal   Collection Time: 04/09/16 10:31 PM  Result Value Ref Range Status   Specimen Description BLOOD RIGHT FOREARM  Final   Special Requests   Final    BOTTLES DRAWN AEROBIC AND ANAEROBIC AER 5cc ANA 5cc   Culture  Setup Time   Final    GRAM POSITIVE COCCI IN CHAINS IN BOTH AEROBIC AND ANAEROBIC BOTTLES CRITICAL VALUE  NOTED.  VALUE IS CONSISTENT WITH PREVIOUSLY REPORTED AND CALLED VALUE.    Culture (A)  Final    GROUP A STREP (S.PYOGENES) ISOLATED SUSCEPTIBILITIES PERFORMED ON PREVIOUS CULTURE WITHIN THE LAST 5 DAYS. HEALTH DEPARTMENT NOTIFIED Performed at [facility], [address] [place], [place] [address]    Report Status 04/12/2016 FINAL  Final  Urine culture     Status: None   Collection Time: 04/10/16 12:01 AM  Result Value Ref Range Status   Specimen Description URINE, RANDOM  Final   Special Requests NONE  Final   Culture   Final    NO GROWTH Performed at [facility], [address] [place], [place] [address]    Report Status 04/11/2016 FINAL  Final  MRSA PCR Screening     Status: None   Collection Time: 04/10/16  4:42 AM  Result Value Ref Range Status   MRSA by PCR NEGATIVE NEGATIVE Final    Comment:        The GeneXpert MRSA Assay (FDA approved for NASAL specimens only), is one component of a comprehensive MRSA colonization surveillance program. It is not intended to diagnose MRSA infection nor to guide or monitor treatment for MRSA infections.   Culture, blood (Routine X 2) w Reflex to ID Panel     Status: None (Preliminary result)   Collection Time: 04/12/16  6:50 AM  Result Value Ref Range Status   Specimen Description BLOOD RIGHT HAND  Final   Special Requests BOTTLES DRAWN AEROBIC AND ANAEROBIC 9CC  Final   Culture NO GROWTH 1 DAY  Final   Report Status PENDING  Incomplete  Culture, blood (Routine X 2) w Reflex to ID Panel     Status: None (Preliminary result)   Collection Time: 04/12/16  7:09 AM  Result Value Ref Range Status   Specimen Description BLOOD RIGHT HAND  Final   Special Requests BOTTLES DRAWN AEROBIC ONLY 8CC  Final   Culture NO GROWTH 1 DAY  Final   Report Status PENDING  Incomplete    Radiology Reports Dg Chest 2 View  Result Date: 04/10/2016 CLINICAL DATA:  31 y/o  M; chest complaints and leukocytosis. EXAM: CHEST  2 VIEW  COMPARISON:  None. FINDINGS: Normal heart size. Prominent pulmonary markings, likely bronchitic changes. Opacity within the posterior costal diaphragmatic sulcus and in the retrosternal space on the lateral view. No pleural effusion or pneumothorax. Bones are unremarkable. IMPRESSION: Prominent pulmonary markings, likely bronchitic changes. Opacities within the retrosternal space and posterior costodiaphragmatic angle on the lateral view are suspicious for areas of pneumonia. The retrosternal focus is centrally lucent and may represent a pulmonary abscess/septic embolus. These results were called by telephone at the time of interpretation on 04/10/2016 at 2:24 am to Dr. Paula Libra , who verbally acknowledged these results. Electronically Signed   By: Mitzi Hansen M.D.   On: 04/10/2016 02:21   Ct Chest W Contrast  Result Date: 04/10/2016 CLINICAL DATA:  Fever EXAM: CT CHEST WITH CONTRAST TECHNIQUE: Multidetector CT imaging of the chest was performed during intravenous contrast administration. CONTRAST:  75mL ISOVUE-300 IOPAMIDOL (ISOVUE-300) INJECTION 61% COMPARISON:  Chest radiograph April 10, 2016 FINDINGS: Cardiovascular: There is no thoracic aortic aneurysm or dissection. Visualized great vessels appear unremarkable. There is mild atherosclerotic calcification in the aorta. The pericardium is not appreciably thickened. No major vessel pulmonary embolus is demonstrated. Mediastinum/Nodes: Visualized thyroid appears normal. There is a sub- carinal lymph node measuring 1.7 x 1.5 cm. There is a right hilar lymph node measuring 1.2 x 0.9 cm. Lungs/Pleura: There is airspace consolidation in both lower lobes, more severe on the right than on the left. There is a small focus of cavitation within the posterior segment right lower lobe consolidation. There are several small cavities in area of consolidation in the anterior segment of the right upper lobe. On axial slice 79 series 8, there is a nodular  opacity in the anterior segment of the right upper lobe measuring 6 x 6 mm. On axial slice 78 series 8, there is a knee 5 x 4 mm nodular opacity in the posterior segment of the right upper lobe. There is no appreciable pleural effusion or pleural thickening. Upper Abdomen: There is peripancreatic fluid with prominence of the pancreas, findings indicative of a degree of acute pancreatitis. There is no well-defined pancreatic mass or pseudocyst in the regions which are visualized. Visualized upper abdomen otherwise appears unremarkable. Musculoskeletal: There are no blastic or lytic bone lesions. IMPRESSION: Multifocal pneumonia. Small areas of apparent cavitation noted in the right upper lobe and right lower lobe regions of consolidation. Gas-forming bacteria or granulomatous etiologies for these areas of pneumonia must be of concern. Mildly enlarged lymph nodes as noted above. These lymph nodes may well have reactive etiology given the areas of multifocal pneumonia. Incomplete visualization of the pancreas with evidence concerning for acute pancreatitis in the regions of the pancreas which are visualized. This finding may warrant CT abdomen and pelvis following oral and intravenous contrast to better delineate the pancreas and establish the extent of inflammation in this region. Mild atherosclerotic calcification in the aorta. These results will be called to the ordering clinician or representative by the Radiologist Assistant, and communication documented in the PACS or zVision Dashboard. Electronically Signed   By: Bretta Bang III M.D.   On: 04/10/2016 15:20   Ct Extrem Up Entire Arm L W/cm  Result Date: 04/10/2016 CLINICAL DATA:  Left upper extremity cellulitis. IV drug use with redness, pain and swelling for several days. Elevated white blood cell count. Question abscess. EXAM: CT OF THE UPPER LEFT EXTREMITY WITH CONTRAST TECHNIQUE: Multidetector CT imaging of the upper left extremity was performed  according to the standard protocol following intravenous contrast administration. COMPARISON:  None. CONTRAST:  ISOVUE-300 IOPAMIDOL (ISOVUE-300) INJECTION 61% FINDINGS: Bones/Joint/Cartilage No fracture, periosteal reaction or bony destructive change. No evidence of elbow or shoulder joint effusion. There is a bone island in the scapula. Ligaments Suboptimally assessed by CT. Muscles and Tendons No evidence of intramuscular fluid collection. Edema suspected in the lateral biceps musculature. Soft tissues Diffuse subcutaneous edema and skin thickening of the left upper extremity from the mid humerus distally. This appears most prominent about the distal humerus, elbow and forearm. Tiny skin defect about the lateral aspect of the mid forearm. No tracking soft tissue air. No peripherally enhancing collection to suggest abscess. Enlarged epitrochlear and axillary nodes are likely reactive. There is occlusive thrombosis throughout the basilic vein in the forearm and upper arm. Thrombus is also suspected of the cephalic vein in the forearm. Incidentally imaged paranasal sinus  inflammation in the maxillary sinuses. IMPRESSION: 1. Diffuse cellulitis of the upper extremity without evidence of focal abscess. Tiny skin defect about the mid forearm, no tracking soft tissue air. 2. Edema within biceps musculature without gross intramuscular collection. 3. Thrombosis of the basilic and cephalic veins. 4. Epitrochlear and axillary lymph nodes are likely reactive. Electronically Signed   By: Rubye Oaks M.D.   On: 04/10/2016 01:59    Lab Data:  CBC:  Recent Labs Lab 04/09/16 2220 04/10/16 0840 04/11/16 0523 04/13/16 0302 04/14/16 0818  WBC 25.4* 19.2* 18.1* 15.2* 15.9*  NEUTROABS 21.8* 16.3*  --   --   --   HGB 12.4* 11.0* 10.3* 12.0* 12.2*  HCT 35.4* 32.8* 30.6* 35.5* 35.5*  MCV 86.1 86.8 86.7 87.4 87.2  PLT 277 215 234 344 481*   Basic Metabolic Panel:  Recent Labs Lab 04/10/16 0310  04/10/16 0840 04/11/16 0523 04/13/16 0302 04/14/16 0818  NA 129* 137 136 137 135  K 3.3* 3.5 3.2* 4.4 4.4  CL 98* 101 106 106 104  CO2 23 29 23 22 23   GLUCOSE 122* 119* 106* 79 90  BUN 12 10 7 6 9   CREATININE 0.73 0.70 0.58* 0.58* 0.60*  CALCIUM 7.4* 7.0* 7.7* 8.4* 8.6*   GFR: Estimated Creatinine Clearance: 137.3 mL/min (by C-G formula based on SCr of 0.6 mg/dL (L)). Liver Function Tests:  Recent Labs Lab 04/09/16 2220 04/10/16 0840  AST 70* 47*  ALT 40 32  ALKPHOS 73 91  BILITOT 0.9 1.1  PROT 6.5 7.3  ALBUMIN 2.7* 2.2*    Recent Labs Lab 04/10/16 1554  LIPASE 15   No results for input(s): AMMONIA in the last 168 hours. Coagulation Profile:  Recent Labs Lab 04/10/16 0840  INR 1.24   Cardiac Enzymes: No results for input(s): CKTOTAL, CKMB, CKMBINDEX, TROPONINI in the last 168 hours. BNP (last 3 results) No results for input(s): PROBNP in the last 8760 hours. HbA1C: No results for input(s): HGBA1C in the last 72 hours. CBG: No results for input(s): GLUCAP in the last 168 hours. Lipid Profile: No results for input(s): CHOL, HDL, LDLCALC, TRIG, CHOLHDL, LDLDIRECT in the last 72 hours. Thyroid Function Tests: No results for input(s): TSH, T4TOTAL, FREET4, T3FREE, THYROIDAB in the last 72 hours. Anemia Panel: No results for input(s): VITAMINB12, FOLATE, FERRITIN, TIBC, IRON, RETICCTPCT in the last 72 hours. Urine analysis:    Component Value Date/Time   COLORURINE AMBER (A) 04/10/2016 0001   APPEARANCEUR CLOUDY (A) 04/10/2016 0001   LABSPEC 1.016 04/10/2016 0001   PHURINE 6.0 04/10/2016 0001   GLUCOSEU NEGATIVE 04/10/2016 0001   HGBUR LARGE (A) 04/10/2016 0001   BILIRUBINUR NEGATIVE 04/10/2016 0001   KETONESUR NEGATIVE 04/10/2016 0001   PROTEINUR 100 (A) 04/10/2016 0001   NITRITE NEGATIVE 04/10/2016 0001   LEUKOCYTESUR NEGATIVE 04/10/2016 0001     Douglas Parrish M.D. [facility] 04/14/2016, 2:14 PM  Pager: [phone] Between 7am to 7pm - call  Pager - [phone]  After 7pm go to [email or web address] - password [email or web address]  Call night coverage person covering after 7pm

## 2016-04-14 NOTE — Progress Notes (Signed)
    [facility]   Reason for visit: Follow up on GAS bacteremia  Interval History: TEE negative for vegetation.  Physical Exam: Constitutional:  Vitals:   04/14/16 1437 04/14/16 1440  BP: (!) 148/65 (!) 148/65  Pulse: 99   Resp: 18   Temp:     patient appears in NAD  Impression: GAS bacteremia from cellulitis, no endocarditis.  Septic emboli appearing areas on lungs may be from peripheral thrombitis.  Repeat blood culture remains negative.   Plan: 1.  Continue IV penicillin while inpatient 2. At discharge, can take amoxicillin 500 mg tid for 14 more days  Thanks for consult.

## 2016-04-14 NOTE — Progress Notes (Signed)
Echocardiogram Echocardiogram Transesophageal has been performed.  Douglas Parrish 04/14/2016, 3:18 PM

## 2016-04-15 ENCOUNTER — Inpatient Hospital Stay (HOSPITAL_COMMUNITY): Payer: Self-pay

## 2016-04-15 DIAGNOSIS — M79609 Pain in unspecified limb: Secondary | ICD-10-CM

## 2016-04-15 LAB — CBC
HEMATOCRIT: 36.1 % — AB (ref 39.0–52.0)
Hemoglobin: 12.1 g/dL — ABNORMAL LOW (ref 13.0–17.0)
MCH: 29.6 pg (ref 26.0–34.0)
MCHC: 33.5 g/dL (ref 30.0–36.0)
MCV: 88.3 fL (ref 78.0–100.0)
PLATELETS: 531 10*3/uL — AB (ref 150–400)
RBC: 4.09 MIL/uL — AB (ref 4.22–5.81)
RDW: 15.1 % (ref 11.5–15.5)
WBC: 12.9 10*3/uL — AB (ref 4.0–10.5)

## 2016-04-15 LAB — BASIC METABOLIC PANEL
ANION GAP: 6 (ref 5–15)
BUN: 11 mg/dL (ref 6–20)
CO2: 25 mmol/L (ref 22–32)
Calcium: 8.5 mg/dL — ABNORMAL LOW (ref 8.9–10.3)
Chloride: 103 mmol/L (ref 101–111)
Creatinine, Ser: 0.59 mg/dL — ABNORMAL LOW (ref 0.61–1.24)
GFR calc Af Amer: >60 mL/min (ref >60)
GLUCOSE: 92 mg/dL (ref 65–99)
POTASSIUM: 4.4 mmol/L (ref 3.5–5.1)
Sodium: 134 mmol/L — ABNORMAL LOW (ref 135–145)

## 2016-04-15 MED ORDER — AMOXICILLIN 500 MG PO CAPS: 500.0000 mg | ORAL_CAPSULE | Freq: Three times a day (TID) | ORAL | 0 refills | Status: AC

## 2016-04-15 MED ORDER — PROMETHAZINE HCL 12.5 MG PO TABS: 12.5000 mg | ORAL_TABLET | Freq: Four times a day (QID) | ORAL | 0 refills | Status: AC | PRN

## 2016-04-15 MED ORDER — OXYCODONE-ACETAMINOPHEN 5-325 MG PO TABS: 1.0000 | ORAL_TABLET | ORAL | 0 refills | Status: AC | PRN

## 2016-04-15 NOTE — Progress Notes (Signed)
*  PRELIMINARY RESULTS* Vascular Ultrasound Left upper extremity venous duplex has been completed.  Preliminary findings: the visualized veins of the left upper extremity appear negative for deep vein thrombosis.  The superficial vein thrombosis seen on prior exam (04/10/16) remains unchanged in the left basilic but has resolved in the cephalic vein.  Douglas FischerCharlotte C Loi Rennaker 04/15/2016, 9:05 AM

## 2016-04-15 NOTE — Progress Notes (Signed)
LUE wounds are healing appropriately with local wound care.  Continue current wound care until healed.  Ortho signed off.

## 2016-04-15 NOTE — Discharge Summary (Signed)
Physician Discharge Summary   Patient ID: Douglas Parrish MRN: [id number] DOB/AGE: 07/17/1985 31 y.o.  Admit date: 04/09/2016 Discharge date: 04/15/2016  Primary Care Physician:  No PCP Per Patient  Discharge Diagnoses:    . Strep pyogenes bacteremia sepsis  (HCC)    Severe Left upper extremity cellulitis in the setting of IV drug use    Superficial venous thrombosis of the cephalic and basilic veins    Multifocal cavitary pneumonia due to septic emboli    Hyponatremia    Lactic acidosis    Polysubstance abuse    IV heroin abuse with heroin withdrawals      Consults:   Cardiology Infectious disease  Recommendations for Outpatient Follow-up:  1.  Patient was placed on amoxicillin 500 mg 3 times a day for 2 weeks per ID recommendations at the time of discharge 2. Please repeat CBC/BMET at next visit 3. Patient was strongly recommended to quit IV drug use   DIET: Regular diet    Allergies:  No Known Allergies   DISCHARGE MEDICATIONS: Current Discharge Medication List    START taking these medications   Details  amoxicillin (AMOXIL) 500 MG capsule Take 1 capsule (500 mg total) by mouth 3 (three) times daily. X 14 days Qty: 42 capsule, Refills: 0    oxyCODONE-acetaminophen (PERCOCET/ROXICET) 5-325 MG tablet Take 1 tablet by mouth every 4 (four) hours as needed for moderate pain or severe pain. Qty: 20 tablet, Refills: 0    promethazine (PHENERGAN) 12.5 MG tablet Take 1 tablet (12.5 mg total) by mouth every 6 (six) hours as needed for nausea or vomiting. Qty: 30 tablet, Refills: 0      CONTINUE these medications which have NOT CHANGED   Details  acetaminophen (TYLENOL) 325 MG tablet Take 650 mg by mouth every 6 (six) hours as needed for mild pain.         Brief H and P: For complete details please refer to admission H and P, but in briefPatient is a 31 year old male with history of IV drug use presented with worsening pain on the left forearm in the last 2-3  days. Patient admitted to have been injecting drugs in the same arm, subjective fevers and chills. CT scan of the left upper extremity showed a diffuse cellulitis with DVT. Patient was started on empiric antibiotics after blood cultures were obtained.  Hospital Course:  Strep pyogenes bacteremia sepsis (HCC)From the left upper extremity cellulitis in the setting of IV drug use - Blood cultures were positive for strep pyogenes. Repeat blood cultures from 1/21 negative so far - CT of the left arm showed diffuse cellulitis without any focal abscess, edema within the biceps musculature without gross intramuscular collection, thrombosis of the basilic and cephalic veins - ortho was consulted, d/w Dr Xu-> currently no need for operative management, continue IV antibiotics. Patient was followed by Dr Roda ShuttersXu prior to discharge and recommended continue local wound care, wound is healing. - Keep left upper arm elevated - 2-D echo showed no vegetations. Patient underwent a TEE on 1/23 which showed no vegetations. - ID, Dr.Comer recommended amoxicillin 500 mg 3 times a day for 14 days at the time of discharge.    Superficial venous thrombosis (HCC) LUE -  CT of the left arm showed thrombosis of the basilic and cephalic veins - Doppler US neg for DVT, superficial thrombosis in the left cephalic and basilic veins. At the time of discharge, patient complained of significant pain in his left upper extremity above the elbow  and tender knot in his arm. Doppler ultrasound was repeated and showed superficial vein thrombosis unchanged in the left basilic vein but has resolved and the cephalic vein. Given the pain in his left upper arm due to cellulitis and superficial vein thrombosis , he was given the prescription for Percocet (#20, no refills)   Multifocal cavitary pneumonia:  - CT chest showed Multifocal pneumonia. Small areas of apparent cavitation noted in the right upper lobe and right lower lobe regions of  consolidation. Likely due to septic emboli - he was initially placed on IV vancomycin and Zosyn. Blood cultures however showed Streptococcus pyogenes and was then transitioned to  IV clindamycin and penicillin - Per infectious disease, Dr Luciana Axe, DC  on amoxicillin for 14 days     Hyponatremia with lactic acidosis - Likely due to #1, improved with IV fluid hydration   Polysubstance abuse,  IV heroin abuse  - Patient counseled on drugs cessation  - Per patient he uses IV heroin half gram 3 times a day, injects, states willing to quit, social work consult was placed -  due to heroin withdrawals, patient was placed on scheduled morphine for 2 days which improved his symptoms. He is now cleared to be discharged home.   Day of Discharge BP 101/64 (BP Location: Right Arm)   Pulse 81   Temp 98.1 F (36.7 C) (Oral)   Resp 20   Ht 5\' 9"  (1.753 m)   Wt 70.6 kg (155 lb 10.3 oz)   SpO2 97%   BMI 22.98 kg/m   Physical Exam: General: Alert and awake oriented x3 not in any acute distress. HEENT: anicteric sclera, pupils reactive to light and accommodation CVS: S1-S2 clear no murmur rubs or gallops Chest: clear to auscultation bilaterally, no wheezing rales or rhonchi Abdomen: soft nontender, nondistended, normal bowel sounds Extremities: no cyanosis, clubbing or edema noted bilaterally.  Left upper extremity cellulitis has significantly improved.  Neuro: Cranial nerves II-XII intact, no focal neurological deficits   The results of significant diagnostics from this hospitalization (including imaging, microbiology, ancillary and laboratory) are listed below for reference.    LAB RESULTS: Basic Metabolic Panel:  Recent Labs Lab 04/14/16 0818 04/15/16 0621  NA 135 134*  K 4.4 4.4  CL 104 103  CO2 23 25  GLUCOSE 90 92  BUN 9 11  CREATININE 0.60* 0.59*  CALCIUM 8.6* 8.5*   Liver Function Tests:  Recent Labs Lab 04/09/16 2220 04/10/16 0840  AST 70* 47*  ALT 40 32  ALKPHOS 73  91  BILITOT 0.9 1.1  PROT 6.5 7.3  ALBUMIN 2.7* 2.2*    Recent Labs Lab 04/10/16 1554  LIPASE 15   No results for input(s): AMMONIA in the last 168 hours. CBC:  Recent Labs Lab 04/10/16 0840  04/14/16 0818 04/15/16 0621  WBC 19.2*  < > 15.9* 12.9*  NEUTROABS 16.3*  --   --   --   HGB 11.0*  < > 12.2* 12.1*  HCT 32.8*  < > 35.5* 36.1*  MCV 86.8  < > 87.2 88.3  PLT 215  < > 481* 531*  < > = values in this interval not displayed. Cardiac Enzymes: No results for input(s): CKTOTAL, CKMB, CKMBINDEX, TROPONINI in the last 168 hours. BNP: Invalid input(s): POCBNP CBG: No results for input(s): GLUCAP in the last 168 hours.  Significant Diagnostic Studies:  Dg Chest 2 View  Result Date: 04/10/2016 CLINICAL DATA:  31 y/o  M; chest complaints and leukocytosis. EXAM: CHEST  2 VIEW COMPARISON:  None. FINDINGS: Normal heart size. Prominent pulmonary markings, likely bronchitic changes. Opacity within the posterior costal diaphragmatic sulcus and in the retrosternal space on the lateral view. No pleural effusion or pneumothorax. Bones are unremarkable. IMPRESSION: Prominent pulmonary markings, likely bronchitic changes. Opacities within the retrosternal space and posterior costodiaphragmatic angle on the lateral view are suspicious for areas of pneumonia. The retrosternal focus is centrally lucent and may represent a pulmonary abscess/septic embolus. These results were called by telephone at the time of interpretation on 04/10/2016 at 2:24 am to Dr. Paula Libra , who verbally acknowledged these results. Electronically Signed   By: Mitzi Hansen M.D.   On: 04/10/2016 02:21   Ct Chest W Contrast  Result Date: 04/10/2016 CLINICAL DATA:  Fever EXAM: CT CHEST WITH CONTRAST TECHNIQUE: Multidetector CT imaging of the chest was performed during intravenous contrast administration. CONTRAST:  75mL ISOVUE-300 IOPAMIDOL (ISOVUE-300) INJECTION 61% COMPARISON:  Chest radiograph April 10, 2016  FINDINGS: Cardiovascular: There is no thoracic aortic aneurysm or dissection. Visualized great vessels appear unremarkable. There is mild atherosclerotic calcification in the aorta. The pericardium is not appreciably thickened. No major vessel pulmonary embolus is demonstrated. Mediastinum/Nodes: Visualized thyroid appears normal. There is a sub- carinal lymph node measuring 1.7 x 1.5 cm. There is a right hilar lymph node measuring 1.2 x 0.9 cm. Lungs/Pleura: There is airspace consolidation in both lower lobes, more severe on the right than on the left. There is a small focus of cavitation within the posterior segment right lower lobe consolidation. There are several small cavities in area of consolidation in the anterior segment of the right upper lobe. On axial slice 79 series 8, there is a nodular opacity in the anterior segment of the right upper lobe measuring 6 x 6 mm. On axial slice 78 series 8, there is a knee 5 x 4 mm nodular opacity in the posterior segment of the right upper lobe. There is no appreciable pleural effusion or pleural thickening. Upper Abdomen: There is peripancreatic fluid with prominence of the pancreas, findings indicative of a degree of acute pancreatitis. There is no well-defined pancreatic mass or pseudocyst in the regions which are visualized. Visualized upper abdomen otherwise appears unremarkable. Musculoskeletal: There are no blastic or lytic bone lesions. IMPRESSION: Multifocal pneumonia. Small areas of apparent cavitation noted in the right upper lobe and right lower lobe regions of consolidation. Gas-forming bacteria or granulomatous etiologies for these areas of pneumonia must be of concern. Mildly enlarged lymph nodes as noted above. These lymph nodes may well have reactive etiology given the areas of multifocal pneumonia. Incomplete visualization of the pancreas with evidence concerning for acute pancreatitis in the regions of the pancreas which are visualized. This finding  may warrant CT abdomen and pelvis following oral and intravenous contrast to better delineate the pancreas and establish the extent of inflammation in this region. Mild atherosclerotic calcification in the aorta. These results will be called to the ordering clinician or representative by the Radiologist Assistant, and communication documented in the PACS or zVision Dashboard. Electronically Signed   By: Bretta Bang III M.D.   On: 04/10/2016 15:20   Ct Extrem Up Entire Arm L W/cm  Result Date: 04/10/2016 CLINICAL DATA:  Left upper extremity cellulitis. IV drug use with redness, pain and swelling for several days. Elevated white blood cell count. Question abscess. EXAM: CT OF THE UPPER LEFT EXTREMITY WITH CONTRAST TECHNIQUE: Multidetector CT imaging of the upper left extremity was performed according to the standard  protocol following intravenous contrast administration. COMPARISON:  None. CONTRAST:  ISOVUE-300 IOPAMIDOL (ISOVUE-300) INJECTION 61% FINDINGS: Bones/Joint/Cartilage No fracture, periosteal reaction or bony destructive change. No evidence of elbow or shoulder joint effusion. There is a bone island in the scapula. Ligaments Suboptimally assessed by CT. Muscles and Tendons No evidence of intramuscular fluid collection. Edema suspected in the lateral biceps musculature. Soft tissues Diffuse subcutaneous edema and skin thickening of the left upper extremity from the mid humerus distally. This appears most prominent about the distal humerus, elbow and forearm. Tiny skin defect about the lateral aspect of the mid forearm. No tracking soft tissue air. No peripherally enhancing collection to suggest abscess. Enlarged epitrochlear and axillary nodes are likely reactive. There is occlusive thrombosis throughout the basilic vein in the forearm and upper arm. Thrombus is also suspected of the cephalic vein in the forearm. Incidentally imaged paranasal sinus inflammation in the maxillary sinuses.  IMPRESSION: 1. Diffuse cellulitis of the upper extremity without evidence of focal abscess. Tiny skin defect about the mid forearm, no tracking soft tissue air. 2. Edema within biceps musculature without gross intramuscular collection. 3. Thrombosis of the basilic and cephalic veins. 4. Epitrochlear and axillary lymph nodes are likely reactive. Electronically Signed   By: Rubye Oaks M.D.   On: 04/10/2016 01:59    2D ECHO: Study Conclusions  - Left ventricle: The cavity size was normal. Wall thickness was   increased in a pattern of mild LVH. Systolic function was normal.   The estimated ejection fraction was in the range of 55% to 60%.   Wall motion was normal; there were no regional wall motion   abnormalities. There was no evidence of elevated ventricular   filling pressure by Doppler parameters. - Mitral valve: There was mild regurgitation. - Right ventricle: Systolic function was normal. - Tricuspid valve: There was trivial regurgitation.  Impressions:  - There was no evidence of a vegetation.  Disposition and Follow-up: Discharge Instructions    Diet general    Complete by:  As directed    Discharge instructions    Complete by:  As directed    Wound care: Wet to dry to forearm wound twice a day   Increase activity slowly    Complete by:  As directed        DISPOSITION:  Home    DISCHARGE FOLLOW-UP Follow-up Information    [facility]. Schedule an appointment as soon as possible for a visit in 1 week.   Why:  for follow-up// they need for patient to call them monday or tuesday to set up appointment Contact information: [address] [place] [address] [phone]           Time spent on Discharge:   Signed:   Briant Angelillo M.D. [facility] 04/15/2016, 12:24 PM Pager: [phone]

## 2016-04-16 ENCOUNTER — Encounter (HOSPITAL_COMMUNITY): Payer: Self-pay | Admitting: Internal Medicine

## 2016-04-16 NOTE — Progress Notes (Signed)
CSW received consult regarding substance use. CSW spoke with patient who stated he didn't really see his drug use as a problem, but that this hospital admission has scared him. He reported only beginning drug use last year. He accepted resources and inquired about housing. CSW provided housing resources.  CSW signing off.  Osborne Cascoadia Kohana Amble LCSWA [phone]

## 2016-04-17 LAB — CULTURE, BLOOD (ROUTINE X 2)
CULTURE: NO GROWTH
Culture: NO GROWTH

## 2016-04-29 NOTE — Anesthesia Postprocedure Evaluation (Signed)
Anesthesia Post Note  Patient: Douglas Parrish  Procedure(s) Performed: Procedure(s) (LRB): TRANSESOPHAGEAL ECHOCARDIOGRAM (TEE) (N/A)  Patient location during evaluation: PACU Anesthesia Type: MAC Level of consciousness: awake and alert Pain management: pain level controlled Vital Signs Assessment: post-procedure vital signs reviewed and stable Respiratory status: spontaneous breathing, nonlabored ventilation, respiratory function stable and patient connected to nasal cannula oxygen Cardiovascular status: stable and blood pressure returned to baseline Anesthetic complications: no       Last Vitals:  Vitals:   04/14/16 2316 04/15/16 0604  BP: 110/62 101/64  Pulse: 92 81  Resp: 20 20  Temp: 37.2 C 36.7 C    Last Pain:  Vitals:   04/15/16 0915  TempSrc:   PainSc: 9                  Dian Laprade,JAMES TERRILL     

## 2016-05-04 NOTE — Transfer of Care (Deleted)
Immediate Anesthesia Transfer of Care Note  Patient: Douglas Parrish  Procedure(s) Performed: Procedure(s): TRANSESOPHAGEAL ECHOCARDIOGRAM (TEE) (N/A)  Patient Location: PACU   Anesthesia Type:MAC and General    Level of Consciousness: awake, alert , oriented and patient cooperative  Airway & Oxygen Therapy: Patient Spontanous Breathing and Patient connected to nasal cannula oxygen  Post-op Assessment: Report given to RN and Post -op Vital signs reviewed and stable  Post vital signs: Reviewed  Last Vitals: 119/58,79,16 100% Vitals:   04/14/16 2316 04/15/16 0604  BP: 110/62 101/64  Pulse: 92 81  Resp: 20 20  Temp: 37.2 C 36.7 C    Last Pain:  Vitals:   04/15/16 0915  TempSrc:   PainSc: 9       Patients Stated Pain Goal: 3 (87/56/43 3295)  Complications: No apparent anesthesia complications

## 2016-05-04 NOTE — Anesthesia Postprocedure Evaluation (Addendum)
Anesthesia Post Note  Patient: Douglas Parrish  Procedure(s) Performed: Procedure(s) (LRB): TRANSESOPHAGEAL ECHOCARDIOGRAM (TEE) (N/A)  Patient location during evaluation: PACU Anesthesia Type: MAC Level of consciousness: awake and alert Pain management: pain level controlled Vital Signs Assessment: post-procedure vital signs reviewed and stable Respiratory status: spontaneous breathing, nonlabored ventilation, respiratory function stable and patient connected to nasal cannula oxygen Cardiovascular status: stable and blood pressure returned to baseline Anesthetic complications: no       Last Vitals:  Vitals:   04/14/16 2316 04/15/16 0604  BP: 110/62 101/64  Pulse: 92 81  Resp: 20 20  Temp: 37.2 C 36.7 C    Last Pain:  Vitals:   04/15/16 0915  TempSrc:   PainSc: 9                  Kyal Arts,JAMES TERRILL

## 2016-06-08 ENCOUNTER — Emergency Department (HOSPITAL_COMMUNITY)
Admission: EM | Admit: 2016-06-08 | Discharge: 2016-06-09 | Disposition: A | Discharge status: Home / Self Care | Payer: Self-pay | Attending: Emergency Medicine | Admitting: Emergency Medicine

## 2016-06-08 ENCOUNTER — Encounter (HOSPITAL_COMMUNITY): Payer: Self-pay | Admitting: *Deleted

## 2016-06-08 DIAGNOSIS — F17210 Nicotine dependence, cigarettes, uncomplicated: Secondary | ICD-10-CM | POA: Insufficient documentation

## 2016-06-08 DIAGNOSIS — F1110 Opioid abuse, uncomplicated: Secondary | ICD-10-CM | POA: Insufficient documentation

## 2016-06-08 HISTORY — DX: Post-traumatic stress disorder, unspecified: F43.10

## 2016-06-08 HISTORY — DX: Bipolar disorder, unspecified: F31.9

## 2016-06-08 NOTE — ED Provider Notes (Signed)
[facility]-EMERGENCY DEPT Provider Note   CSN: [id number] Arrival date & time: 06/08/16  1930     History   Chief Complaint Chief Complaint  Patient presents with  . Addiction Problem  . Medical Clearance    HPI Douglas Parrish is a 31 y.o. male.  31 year old male presents for a detox from heroin. Patient states that he denies any suicidal or homicidal ideations. No vomiting or diarrhea. No abdominal discomfort. Was brought in by the police when he requested help with his substance abuse problem. Patient has a known history of PTSD and bipolar disorder. Denies any prior suicide attempt. Denies any somatic complaints at this time.      Past Medical History:  Diagnosis Date  . Bipolar depression (HCC)   . PTSD (post-traumatic stress disorder)     Patient Active Problem List   Diagnosis Date Noted  . Sepsis (HCC) 04/10/2016  . Hyponatremia 04/10/2016  . Heroin abuse 04/10/2016  . Cellulitis of left upper extremity 04/10/2016  . Deep venous thrombosis (HCC) 04/10/2016  . Acute septic pulmonary embolism without acute cor pulmonale (HCC) 04/10/2016  . Abnormal CXR     Past Surgical History:  Procedure Laterality Date  . TEE WITHOUT CARDIOVERSION N/A 04/14/2016   Procedure: TRANSESOPHAGEAL ECHOCARDIOGRAM (TEE);  Surgeon: Chrystie NoseKenneth C Hilty, MD;  [facility];  Service: Cardiovascular;  Laterality: N/A;       Home Medications    Prior to Admission medications   Medication Sig Start Date End Date Taking? Authorizing Provider  acetaminophen (TYLENOL) 325 MG tablet Take 650 mg by mouth every 6 (six) hours as needed for mild pain.    Historical Provider, MD  amoxicillin (AMOXIL) 500 MG capsule Take 1 capsule (500 mg total) by mouth 3 (three) times daily. X 14 days 04/15/16   Ripudeep Jenna LuoK Rai, MD  oxyCODONE-acetaminophen (PERCOCET/ROXICET) 5-325 MG tablet Take 1 tablet by mouth every 4 (four) hours as needed for moderate pain or severe pain. 04/15/16   Ripudeep Jenna LuoK Rai, MD    promethazine (PHENERGAN) 12.5 MG tablet Take 1 tablet (12.5 mg total) by mouth every 6 (six) hours as needed for nausea or vomiting. 04/15/16   Ripudeep Jenna LuoK Rai, MD    Family History Family History  Problem Relation Age of Onset  . CAD Neg Hx   . Diabetes Mellitus II Neg Hx     Social History Social History  Substance Use Topics  . Smoking status: Current Every Day Smoker    Packs/day: 0.50    Types: Cigarettes  . Smokeless tobacco: Never Used  . Alcohol use No     Allergies   Patient has no known allergies.   Review of Systems Review of Systems  All other systems reviewed and are negative.    Physical Exam Updated Vital Signs BP (!) 127/53 (BP Location: Left Arm)   Pulse (!) 114   Temp 98.7 F (37.1 C) (Oral)   Resp 20   Ht 5\' 8"  (1.727 m)   Wt 63.5 kg   SpO2 99%   BMI 21.29 kg/m   Physical Exam  Constitutional: He is oriented to person, place, and time. He appears well-developed and well-nourished.  Non-toxic appearance. No distress.  HENT:  Head: Normocephalic and atraumatic.  Eyes: Conjunctivae, EOM and lids are normal. Pupils are equal, round, and reactive to light.  Neck: Normal range of motion. Neck supple. No tracheal deviation present. No thyroid mass present.  Cardiovascular: Normal rate, regular rhythm and normal heart sounds.  Exam reveals  no gallop.   No murmur heard. Pulmonary/Chest: Effort normal and breath sounds normal. No stridor. No respiratory distress. He has no decreased breath sounds. He has no wheezes. He has no rhonchi. He has no rales.  Abdominal: Soft. Normal appearance and bowel sounds are normal. He exhibits no distension. There is no tenderness. There is no rebound and no CVA tenderness.  Musculoskeletal: Normal range of motion. He exhibits no edema or tenderness.  Neurological: He is alert and oriented to person, place, and time. He has normal strength. No cranial nerve deficit or sensory deficit. GCS eye subscore is 4. GCS verbal  subscore is 5. GCS motor subscore is 6.  Skin: Skin is warm and dry. No abrasion and no rash noted.  Psychiatric: He has a normal mood and affect. His speech is normal and behavior is normal. He expresses no suicidal plans and no homicidal plans.  Nursing note and vitals reviewed.    ED Treatments / Results  Labs (all labs ordered are listed, but only abnormal results are displayed) Labs Reviewed - No data to display  EKG  EKG Interpretation None       Radiology No results found.  Procedures Procedures (including critical care time)  Medications Ordered in ED Medications - No data to display   Initial Impression / Assessment and Plan / ED Course  I have reviewed the triage vital signs and the nursing notes.  Pertinent labs & imaging results that were available during my care of the patient were reviewed by me and considered in my medical decision making (see chart for details).     Patient given outpatient resources and encouraged to see [facility]  Final Clinical Impressions(s) / ED Diagnoses   Final diagnoses:  Opioid abuse    New Prescriptions New Prescriptions   No medications on file     Lorre Nick, MD 06/08/16 2020

## 2016-06-08 NOTE — ED Notes (Signed)
Bed: WA26 Expected date:  Expected time:  Means of arrival:  Comments: 

## 2016-06-08 NOTE — ED Triage Notes (Signed)
Pt brought in by [employer] for substance abuse.

## 2016-06-08 NOTE — ED Notes (Signed)
Pt stated "I have PTSD from [place] and [place].  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  I was dx'd bipolar a long time ago but I was never given meds.  I wouldn't take them.  I'm stronger than that.  I have 2 children back in [place] that my mom takes care of.  My wife there up and left me because of drugs.  I moved here about 1 year ago when I met up with a girl I knew from HS.  That didn't work out and now the girlfriend I'm with is bad for me.  Can you believe that my girlfriend and friend just left me on the sidewalk like this?"

## 2016-06-09 ENCOUNTER — Encounter (HOSPITAL_COMMUNITY): Payer: Self-pay | Admitting: Internal Medicine

## 2016-06-09 NOTE — ED Notes (Signed)
Pt left discharge paperwork, with outpatient resources, laying in the bed.

## 2016-06-09 NOTE — ED Notes (Signed)
Pt snoring, NAD

## 2016-08-21 NOTE — Addendum Note (Signed)
Addendum  created 08/21/16 0957 by Sharee HolsterMassagee, Reah Justo, MD   Sign clinical note

## 2016-10-14 ENCOUNTER — Emergency Department (HOSPITAL_COMMUNITY)
Admission: EM | Admit: 2016-10-14 | Discharge: 2016-10-14 | Disposition: A | Discharge status: Home / Self Care | Payer: Self-pay | Attending: Emergency Medicine | Admitting: Emergency Medicine

## 2016-10-14 ENCOUNTER — Emergency Department (HOSPITAL_COMMUNITY): Payer: Self-pay

## 2016-10-14 DIAGNOSIS — F17210 Nicotine dependence, cigarettes, uncomplicated: Secondary | ICD-10-CM | POA: Insufficient documentation

## 2016-10-14 DIAGNOSIS — R0789 Other chest pain: Secondary | ICD-10-CM | POA: Insufficient documentation

## 2016-10-14 DIAGNOSIS — Y929 Unspecified place or not applicable: Secondary | ICD-10-CM | POA: Insufficient documentation

## 2016-10-14 DIAGNOSIS — M546 Pain in thoracic spine: Secondary | ICD-10-CM | POA: Insufficient documentation

## 2016-10-14 DIAGNOSIS — T148XXA Other injury of unspecified body region, initial encounter: Secondary | ICD-10-CM

## 2016-10-14 DIAGNOSIS — S29012A Strain of muscle and tendon of back wall of thorax, initial encounter: Secondary | ICD-10-CM | POA: Insufficient documentation

## 2016-10-14 DIAGNOSIS — X500XXA Overexertion from strenuous movement or load, initial encounter: Secondary | ICD-10-CM | POA: Insufficient documentation

## 2016-10-14 DIAGNOSIS — Y999 Unspecified external cause status: Secondary | ICD-10-CM | POA: Insufficient documentation

## 2016-10-14 DIAGNOSIS — Y939 Activity, unspecified: Secondary | ICD-10-CM | POA: Insufficient documentation

## 2016-10-14 DIAGNOSIS — Z86711 Personal history of pulmonary embolism: Secondary | ICD-10-CM | POA: Insufficient documentation

## 2016-10-14 LAB — BASIC METABOLIC PANEL
ANION GAP: 10 (ref 5–15)
BUN: 7 mg/dL (ref 6–20)
CHLORIDE: 107 mmol/L (ref 101–111)
CO2: 24 mmol/L (ref 22–32)
Calcium: 9.2 mg/dL (ref 8.9–10.3)
Creatinine, Ser: 0.99 mg/dL (ref 0.61–1.24)
GFR calc Af Amer: >60 mL/min (ref >60)
GFR calc non Af Amer: >60 mL/min (ref >60)
Glucose, Bld: 97 mg/dL (ref 65–99)
POTASSIUM: 4 mmol/L (ref 3.5–5.1)
Sodium: 141 mmol/L (ref 135–145)

## 2016-10-14 LAB — CBC
HEMATOCRIT: 42.7 % (ref 39.0–52.0)
HEMOGLOBIN: 14.7 g/dL (ref 13.0–17.0)
MCH: 29.5 pg (ref 26.0–34.0)
MCHC: 34.4 g/dL (ref 30.0–36.0)
MCV: 85.7 fL (ref 78.0–100.0)
Platelets: 313 10*3/uL (ref 150–400)
RBC: 4.98 MIL/uL (ref 4.22–5.81)
RDW: 15.1 % (ref 11.5–15.5)
WBC: 12 10*3/uL — ABNORMAL HIGH (ref 4.0–10.5)

## 2016-10-14 LAB — POCT I-STAT TROPONIN I: TROPONIN I, POC: 0 ng/mL (ref 0.00–0.08)

## 2016-10-14 MED ORDER — ACETAMINOPHEN 500 MG PO TABS
1000.0000 mg | ORAL_TABLET | Freq: Once | ORAL | Status: AC
  Administered 2016-10-14: 1000 mg via ORAL
  Filled 2016-10-14: qty 2

## 2016-10-14 MED ORDER — CEPHALEXIN 500 MG PO CAPS: 500.0000 mg | ORAL_CAPSULE | Freq: Four times a day (QID) | ORAL | 0 refills | Status: AC

## 2016-10-14 MED ORDER — CEPHALEXIN 500 MG PO CAPS
500.0000 mg | ORAL_CAPSULE | Freq: Once | ORAL | Status: AC
  Administered 2016-10-14: 500 mg via ORAL
  Filled 2016-10-14: qty 1

## 2016-10-14 MED ORDER — KETOROLAC TROMETHAMINE 60 MG/2ML IM SOLN
30.0000 mg | Freq: Once | INTRAMUSCULAR | Status: AC
  Administered 2016-10-14: 30 mg via INTRAMUSCULAR
  Filled 2016-10-14: qty 2

## 2016-10-14 NOTE — ED Provider Notes (Signed)
[facility]-EMERGENCY DEPT Provider Note   CSN: [id number] Arrival date & time: 10/14/16  1533     History   Chief Complaint Chief Complaint  Patient presents with  . Chest Pain    HPI Douglas Parrish is a 31 y.o. male.  The history is provided by the patient.  Chest Pain   This is a recurrent problem. Episode onset: 3 days. The problem occurs constantly. Progression since onset: fluctuating. The pain is associated with movement (palpation). The pain is moderate. The quality of the pain is described as sharp (shooting). Radiates to: started in the back and now on bilateral midaxillary area. The symptoms are aggravated by certain positions. Associated symptoms include back pain, cough, diaphoresis, a fever (reported 101; now resolved), nausea and shortness of breath. He has tried nothing for the symptoms. Risk factors include substance abuse.   Last IVDU was 6 weeks.   Reports heavy lifting of about 80lbs.   Past Medical History:  Diagnosis Date  . Bipolar depression (HCC)   . PTSD (post-traumatic stress disorder)     Patient Active Problem List   Diagnosis Date Noted  . Sepsis (HCC) 04/10/2016  . Hyponatremia 04/10/2016  . Heroin abuse 04/10/2016  . Cellulitis of left upper extremity 04/10/2016  . Deep venous thrombosis (HCC) 04/10/2016  . Acute septic pulmonary embolism without acute cor pulmonale (HCC) 04/10/2016  . Abnormal CXR     Past Surgical History:  Procedure Laterality Date  . TEE WITHOUT CARDIOVERSION N/A 04/14/2016   Procedure: TRANSESOPHAGEAL ECHOCARDIOGRAM (TEE);  Surgeon: Chrystie NoseKenneth C Hilty, MD;  [facility];  Service: Cardiovascular;  Laterality: N/A;       Home Medications    Prior to Admission medications   Medication Sig Start Date End Date Taking? Authorizing Provider  acetaminophen (TYLENOL) 325 MG tablet Take 650 mg by mouth every 6 (six) hours as needed for mild pain.    [provider]  amoxicillin (AMOXIL) 500 MG capsule Take 1  capsule (500 mg total) by mouth 3 (three) times daily. X 14 days 04/15/16   Rai, Ripudeep K, MD  cephALEXin (KEFLEX) 500 MG capsule Take 1 capsule (500 mg total) by mouth 4 (four) times daily. 10/14/16 10/24/16  Nira Connardama, Pedro Eduardo, MD  oxyCODONE-acetaminophen (PERCOCET/ROXICET) 5-325 MG tablet Take 1 tablet by mouth every 4 (four) hours as needed for moderate pain or severe pain. 04/15/16   Rai, Delene Ruffiniipudeep K, MD  promethazine (PHENERGAN) 12.5 MG tablet Take 1 tablet (12.5 mg total) by mouth every 6 (six) hours as needed for nausea or vomiting. 04/15/16   Cathren Harshai, Ripudeep K, MD    Family History Family History  Problem Relation Age of Onset  . CAD Neg Hx   . Diabetes Mellitus II Neg Hx     Social History Social History  Substance Use Topics  . Smoking status: Current Every Day Smoker    Packs/day: 0.50    Types: Cigarettes  . Smokeless tobacco: Never Used  . Alcohol use No     Allergies   Penicillins   Review of Systems Review of Systems  Constitutional: Positive for diaphoresis and fever (reported 101; now resolved).  Respiratory: Positive for cough and shortness of breath.   Cardiovascular: Positive for chest pain.  Gastrointestinal: Positive for nausea.  Musculoskeletal: Positive for back pain.   All other systems are reviewed and are negative for acute change except as noted in the HPI   Physical Exam Updated Vital Signs BP 124/81 (BP Location: Left Arm)  Pulse 99   Temp 98.5 F (36.9 C) (Oral)   Resp 12   SpO2 99%   Physical Exam  Constitutional: He is oriented to person, place, and time. He appears well-developed and well-nourished. No distress.  HENT:  Head: Normocephalic and atraumatic.  Nose: Nose normal.  Eyes: Pupils are equal, round, and reactive to light. Conjunctivae and EOM are normal. Right eye exhibits no discharge. Left eye exhibits no discharge. No scleral icterus.  Neck: Normal range of motion. Neck supple.  Cardiovascular: Normal rate and regular  rhythm.  Exam reveals no gallop and no friction rub.   No murmur heard. Pulmonary/Chest: Effort normal and breath sounds normal. No stridor. No respiratory distress. He has no rales.     He exhibits tenderness.    TTP of bilateral Lat Dorsi and Subscapulris. R>L  Abdominal: Soft. He exhibits no distension. There is no tenderness.  Musculoskeletal: He exhibits no edema or tenderness.  Neurological: He is alert and oriented to person, place, and time.  Skin: Skin is warm and dry. No rash noted. He is not diaphoretic. No erythema.  Psychiatric: He has a normal mood and affect.  Vitals reviewed.    ED Treatments / Results  Labs (all labs ordered are listed, but only abnormal results are displayed) Labs Reviewed  CBC - Abnormal; Notable for the following:       Result Value   WBC 12.0 (*)    All other components within normal limits  CULTURE, BLOOD (ROUTINE X 2)  CULTURE, BLOOD (ROUTINE X 2)  CULTURE, BLOOD (SINGLE)  BASIC METABOLIC PANEL  I-STAT TROPONIN, ED  POCT I-STAT TROPONIN I    EKG  EKG Interpretation  Date/Time:  Wednesday October 14 2016 15:54:09 EDT Ventricular Rate:  106 PR Interval:    QRS Duration: 96 QT Interval:  309 QTC Calculation: 411 R Axis:   83 Text Interpretation:  Sinus tachycardia RAE, consider biatrial enlargement No significant change since last tracing Confirmed by Drema Pry [phone]) on 10/14/2016 5:44:08 PM       Radiology Dg Chest 2 View  Result Date: 10/14/2016 CLINICAL DATA:  Cough.  Shortness of breath.  Chest pain . EXAM: CHEST  2 VIEW COMPARISON:  CT 04/10/2016 . FINDINGS: Mediastinum and hilar structures normal. Lungs are clear. Interim clearing of previously identified multifocal infiltrates including cavitary infiltrates. No pleural effusion or pneumothorax. No acute bony abnormality identified. IMPRESSION: No acute cardiopulmonary disease. Interim clearing of previously noted infiltrates noted on chest x-ray 04/10/2016 .  Electronically Signed   By: Maisie Fus  Register   On: 10/14/2016 16:50    Procedures Procedures (including critical care time)  Medications Ordered in ED Medications  cephALEXin (KEFLEX) capsule 500 mg (500 mg Oral Given 10/14/16 1813)  acetaminophen (TYLENOL) tablet 1,000 mg (1,000 mg Oral Given 10/14/16 1813)  ketorolac (TORADOL) injection 30 mg (30 mg Intramuscular Given 10/14/16 1813)     Initial Impression / Assessment and Plan / ED Course  I have reviewed the triage vital signs and the nursing notes.  Pertinent labs & imaging results that were available during my care of the patient were reviewed by me and considered in my medical decision making (see chart for details).     Highly atypical lateral chest wall pain inconsistent with ACS. EKG without acute ischemic changes or evidence of pericarditis. Initial troponin drawn at triage was negative. Do not feel that additional workup for ACS is needed at this time. Low pretest probability for pulmonary embolism. Presentation is classic  for aortic dissection or esophageal perforation.  Chest x-ray without evidence suggestive of pneumonia, pneumothorax, pneumomediastinum.  No abnormal contour of the mediastinum to suggest dissection. No evidence of acute injuries.  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 Patient will be in the custody of [employer] for unknown period of time. Patient was instructed to provide us with appropriate contact information so we can touch base with him regarding his culture results. If cultures return negative, patient can discontinue the empiric antibiotics.  The patient is safe for discharge with strict return precautions.   Final Clinical Impressions(s) / ED Diagnoses   Final diagnoses:  Chest wall pain  Acute bilateral thoracic back pain  Muscle strain   Disposition:  Discharge  Condition: Good  I have discussed the results, Dx and Tx plan with the patient who expressed understanding and agree(s) with the plan. Discharge instructions discussed at great length. The patient was given strict return precautions who verbalized understanding of the instructions. No further questions at time of discharge.    New Prescriptions   CEPHALEXIN (KEFLEX) 500 MG CAPSULE    Take 1 capsule (500 mg total) by mouth 4 (four) times daily.    Follow Up: Primary care provider     [facility] [address] [id number] [place] [id number]  If you develop persistent fevers, severe fatigue, low blood pressure, inability to tolerate oral hydration or antibiotics use.      Nira Connardama, Pedro Eduardo, MD 10/14/16 [phone]

## 2016-10-14 NOTE — ED Triage Notes (Addendum)
Pt presents in police custody stating that he started having chest pain last night and it is continuing today. Smoked crystal meth this morning. Tachycardic. Hx of endocarditis. Alert and oriented.

## 2016-10-19 LAB — CULTURE, BLOOD (ROUTINE X 2)
Culture: NO GROWTH
SPECIAL REQUESTS: ADEQUATE

## 2018-01-31 ENCOUNTER — Emergency Department (HOSPITAL_COMMUNITY)
Admission: EM | Admit: 2018-01-31 | Discharge: 2018-02-01 | Disposition: A | Discharge status: Home / Self Care | Payer: Self-pay | Attending: Emergency Medicine | Admitting: Emergency Medicine

## 2018-01-31 ENCOUNTER — Encounter (HOSPITAL_COMMUNITY): Payer: Self-pay | Admitting: Emergency Medicine

## 2018-01-31 DIAGNOSIS — Z5321 Procedure and treatment not carried out due to patient leaving prior to being seen by health care provider: Secondary | ICD-10-CM | POA: Insufficient documentation

## 2018-01-31 DIAGNOSIS — M549 Dorsalgia, unspecified: Secondary | ICD-10-CM | POA: Insufficient documentation

## 2018-01-31 LAB — BASIC METABOLIC PANEL
Anion gap: 8 (ref 5–15)
BUN: 8 mg/dL (ref 6–20)
CALCIUM: 9.3 mg/dL (ref 8.9–10.3)
CO2: 25 mmol/L (ref 22–32)
Chloride: 105 mmol/L (ref 98–111)
Creatinine, Ser: 0.88 mg/dL (ref 0.61–1.24)
GFR calc non Af Amer: >60 mL/min (ref >60)
GLUCOSE: 96 mg/dL (ref 70–99)
Potassium: 3.8 mmol/L (ref 3.5–5.1)
Sodium: 138 mmol/L (ref 135–145)

## 2018-01-31 LAB — CBC WITH DIFFERENTIAL/PLATELET
Abs Immature Granulocytes: 0.02 10*3/uL (ref 0.00–0.07)
BASOS ABS: 0.1 10*3/uL (ref 0.0–0.1)
Basophils Relative: 1 %
Eosinophils Absolute: 0.4 10*3/uL (ref 0.0–0.5)
Eosinophils Relative: 4 %
HEMATOCRIT: 43.4 % (ref 39.0–52.0)
Hemoglobin: 14 g/dL (ref 13.0–17.0)
Immature Granulocytes: 0 %
LYMPHS ABS: 3.4 10*3/uL (ref 0.7–4.0)
Lymphocytes Relative: 36 %
MCH: 28.9 pg (ref 26.0–34.0)
MCHC: 32.3 g/dL (ref 30.0–36.0)
MCV: 89.5 fL (ref 80.0–100.0)
Monocytes Absolute: 0.9 10*3/uL (ref 0.1–1.0)
Monocytes Relative: 9 %
NRBC: 0 % (ref 0.0–0.2)
Neutro Abs: 4.8 10*3/uL (ref 1.7–7.7)
Neutrophils Relative %: 50 %
Platelets: 293 10*3/uL (ref 150–400)
RBC: 4.85 MIL/uL (ref 4.22–5.81)
RDW: 13.2 % (ref 11.5–15.5)
WBC: 9.5 10*3/uL (ref 4.0–10.5)

## 2018-01-31 MED ORDER — OXYCODONE-ACETAMINOPHEN 5-325 MG PO TABS
1.0000 | ORAL_TABLET | Freq: Once | ORAL | Status: AC
  Administered 2018-01-31: 1 via ORAL
  Filled 2018-01-31: qty 1

## 2018-01-31 NOTE — ED Triage Notes (Signed)
Patient reports  low back pain worse when lying onset last week , no recent injury , denies dysuria or hematuria .

## 2018-02-01 NOTE — ED Notes (Signed)
Called for patient to take to room, no answer.

## 2018-02-01 NOTE — ED Notes (Signed)
Called for Pt to take to RM. No answer 

## 2018-02-05 IMAGING — CT CT CHEST W/ CM
2 of 4 series · 15 of 36 positions shown, 18 images · IV contrast (APPLIED)
Comparison: Chest radiograph April 10, 2016

CLINICAL DATA: Fever

EXAM:
CT CHEST WITH CONTRAST
TECHNIQUE: Multidetector CT imaging of the chest was performed during
intravenous contrast administration.
CONTRAST:  75mL HQTWBA-HVV IOPAMIDOL (HQTWBA-HVV) INJECTION 61%

[Series 4: thorax · axial · 0.68mm/px · z∈[+1362,+1638]mm · 12 of 156 slices shown, 15 images]
[im 9/156  mediastinal]
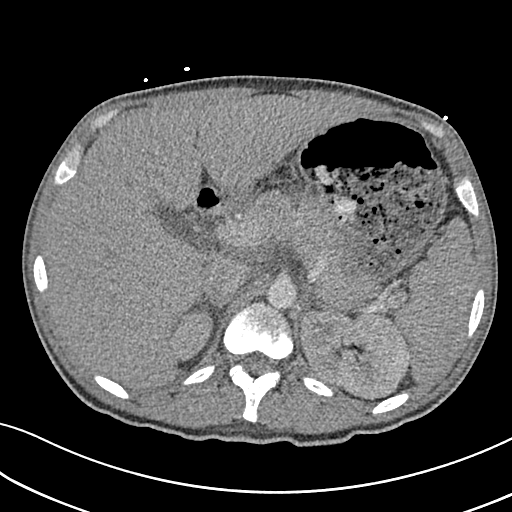
[im 9/156  lung]
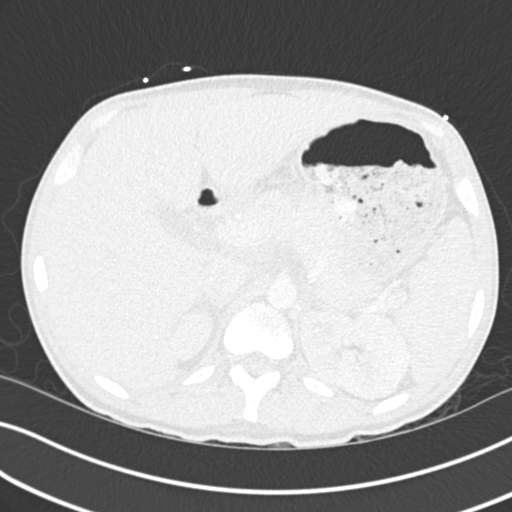
[im 25/156  lung]
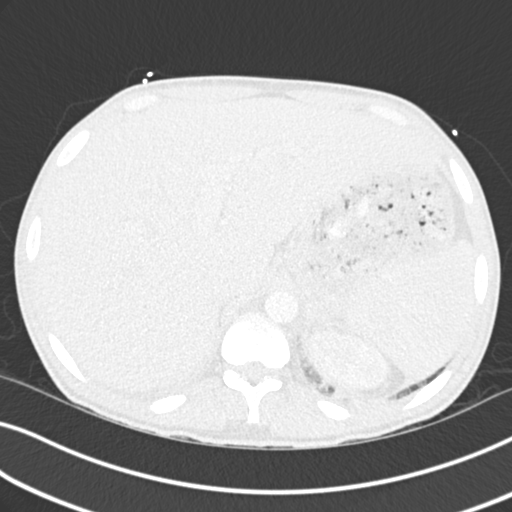
[im 33/156  lung]
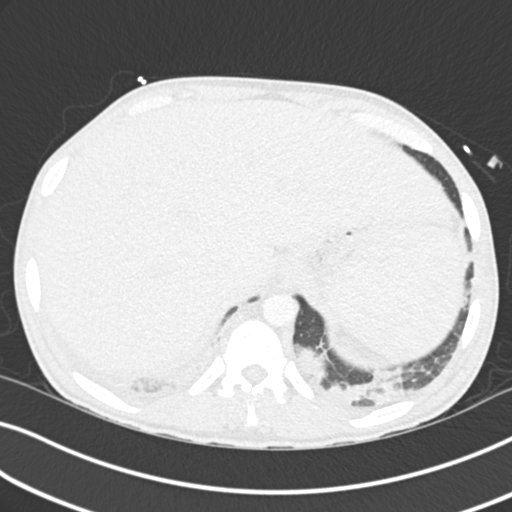
[im 49/156  lung]
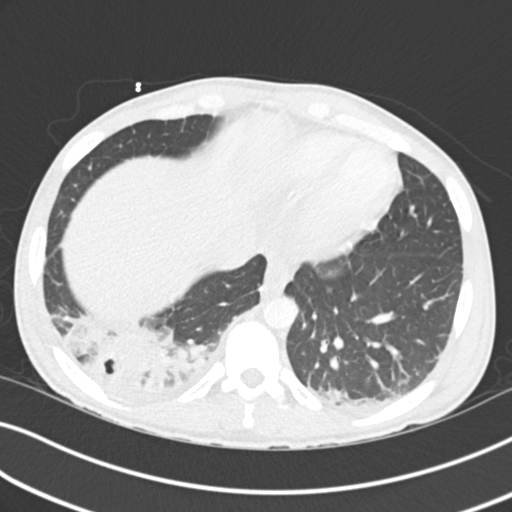
[im 58/156  mediastinal]
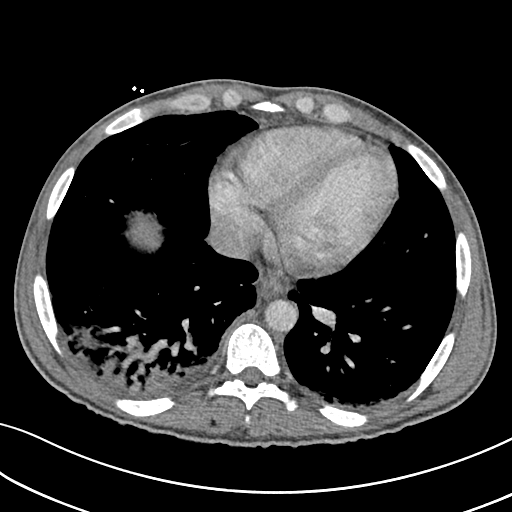
[im 58/156  lung]
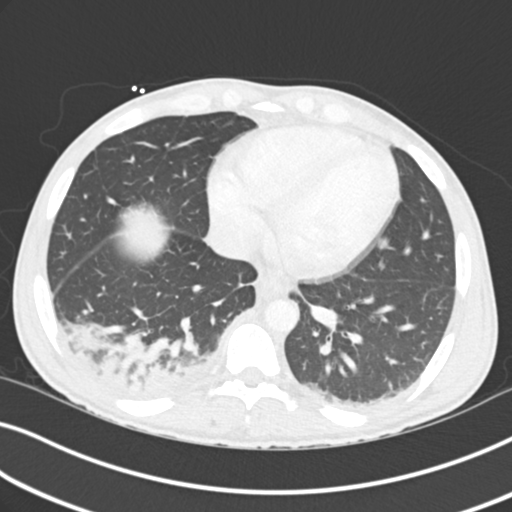
[im 74/156  lung]
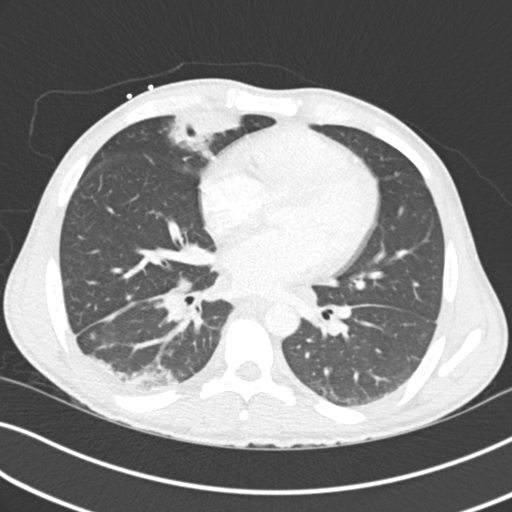
[im 82/156  lung]
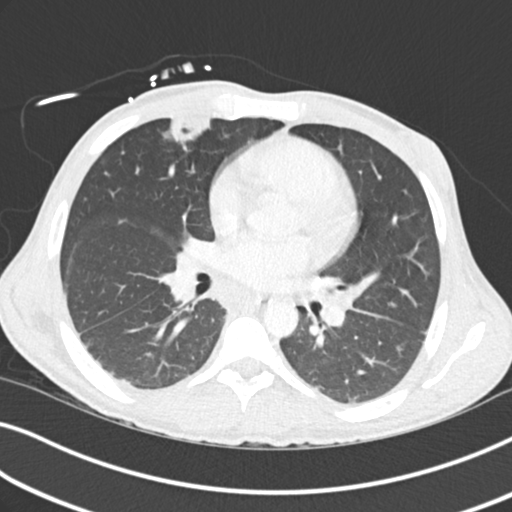
[im 98/156  lung]
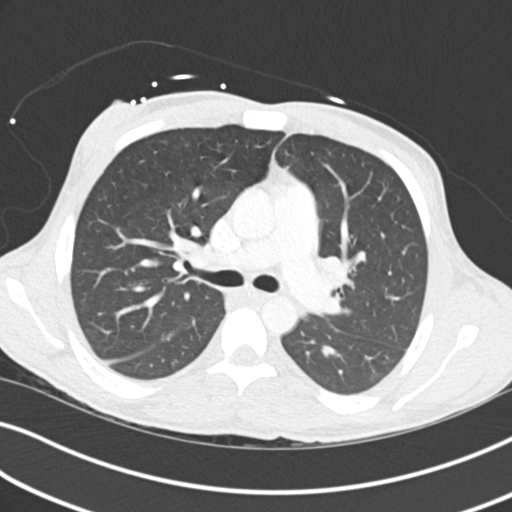
[im 107/156  mediastinal]
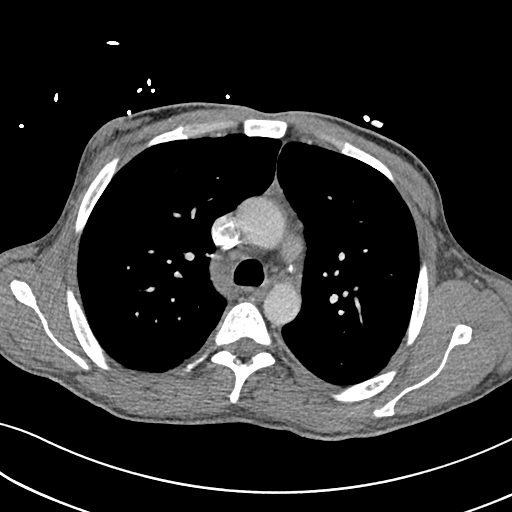
[im 107/156  lung]
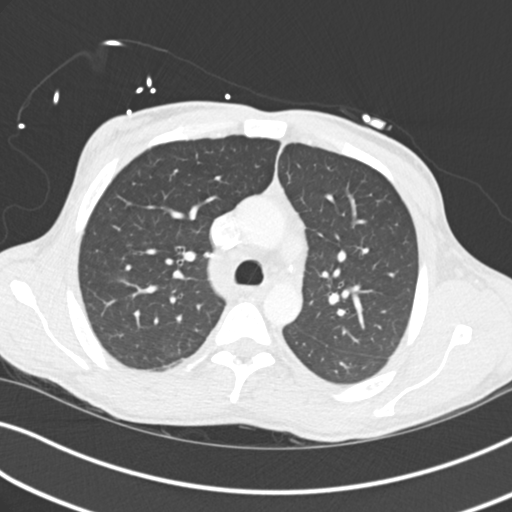
[im 123/156  lung]
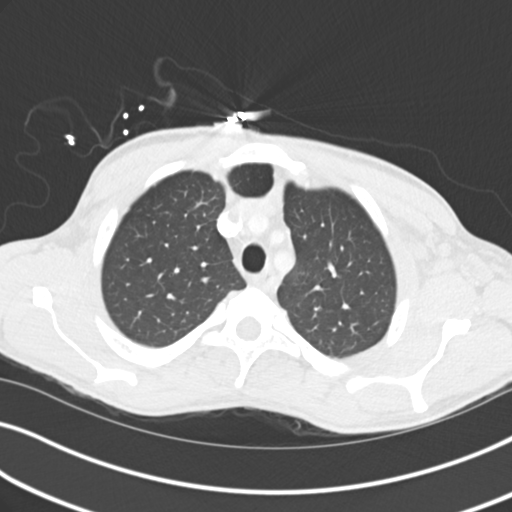
[im 131/156  lung]
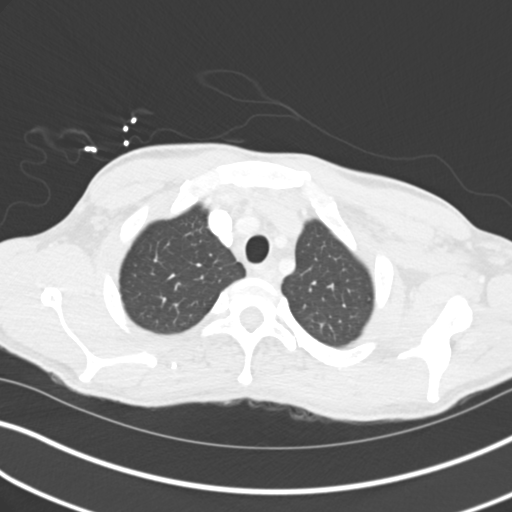
[im 147/156  lung]
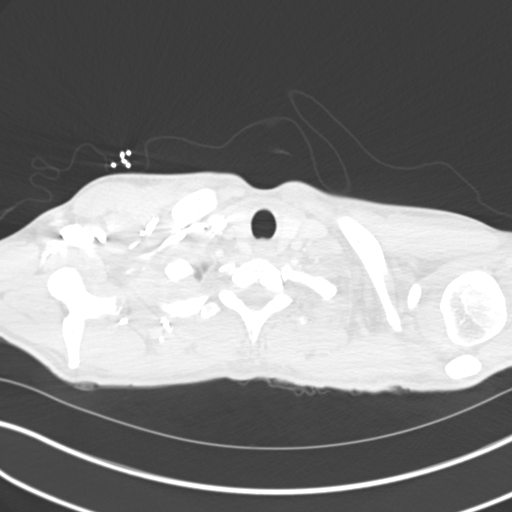

[Series 6: coronal · coronal · 0.61mm/px · 3 of 132 slices shown]
[im 27/132  lung]
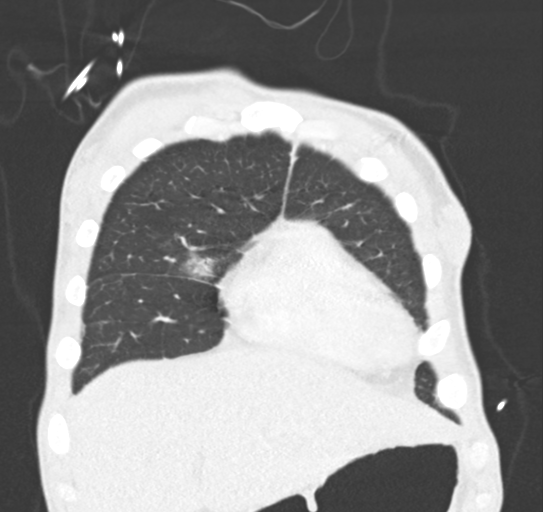
[im 53/132  lung]
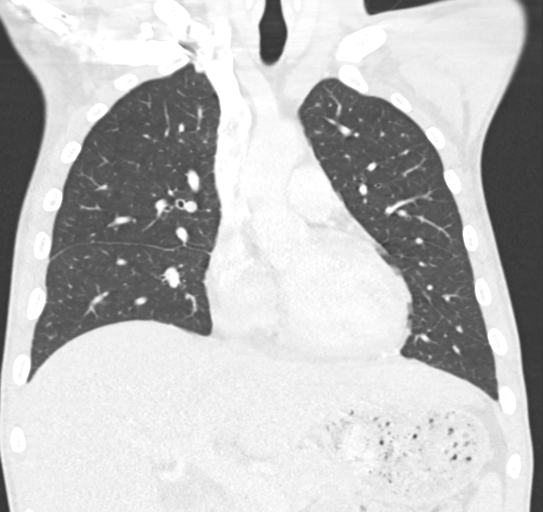
[im 79/132  lung]
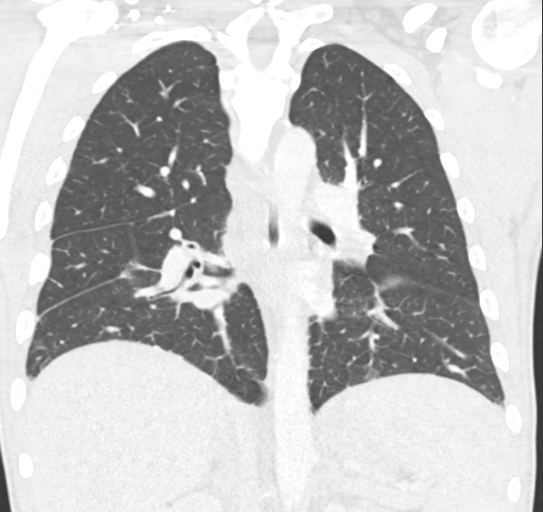

[15 of 36 positions shown; findings below may reference images not displayed]

FINDINGS: Cardiovascular: There is no thoracic aortic aneurysm or dissection.
Visualized great vessels appear unremarkable. There is mild
atherosclerotic calcification in the aorta. The pericardium is not
appreciably thickened. No major vessel pulmonary embolus is
demonstrated.

Mediastinum/Nodes: Visualized thyroid appears normal. There is a
sub- carinal lymph node measuring 1.7 x 1.5 cm. There is a right
hilar lymph node measuring 1.2 x 0.9 cm.

Lungs/Pleura: There is airspace consolidation in both lower lobes,
more severe on the right than on the left. There is a small focus of
cavitation within the posterior segment right lower lobe
consolidation. There are several small cavities in area of
consolidation in the anterior segment of the right upper lobe. On
axial slice 79 series 8, there is a nodular opacity in the anterior
segment of the right upper lobe measuring 6 x 6 mm. On axial slice
78 series 8, there is a knee 5 x 4 mm nodular opacity in the
posterior segment of the right upper lobe. There is no appreciable
pleural effusion or pleural thickening.

Upper Abdomen: There is peripancreatic fluid with prominence of the
pancreas, findings indicative of a degree of acute pancreatitis.
There is no well-defined pancreatic mass or pseudocyst in the
regions which are visualized. Visualized upper abdomen otherwise
appears unremarkable.

Musculoskeletal: There are no blastic or lytic bone lesions.
IMPRESSION: Multifocal pneumonia. Small areas of apparent cavitation noted in
the right upper lobe and right lower lobe regions of consolidation.
Gas-forming bacteria or granulomatous etiologies for these areas of
pneumonia must be of concern.

Mildly enlarged lymph nodes as noted above. These lymph nodes may
well have reactive etiology given the areas of multifocal pneumonia.

Incomplete visualization of the pancreas with evidence concerning
for acute pancreatitis in the regions of the pancreas which are
visualized. This finding may warrant CT abdomen and pelvis following
oral and intravenous contrast to better delineate the pancreas and
establish the extent of inflammation in this region.

Mild atherosclerotic calcification in the aorta.

These results will be called to the ordering clinician or
representative by the Radiologist Assistant, and communication
documented in the PACS or zVision Dashboard.

## 2018-06-17 ENCOUNTER — Other Ambulatory Visit: Payer: Self-pay

## 2018-06-17 ENCOUNTER — Emergency Department (HOSPITAL_COMMUNITY)
Admission: EM | Admit: 2018-06-17 | Discharge: 2018-06-17 | Disposition: A | Discharge status: Home / Self Care | Payer: Self-pay | Attending: Emergency Medicine | Admitting: Emergency Medicine

## 2018-06-17 ENCOUNTER — Encounter (HOSPITAL_COMMUNITY): Payer: Self-pay | Admitting: Emergency Medicine

## 2018-06-17 DIAGNOSIS — F1910 Other psychoactive substance abuse, uncomplicated: Secondary | ICD-10-CM | POA: Insufficient documentation

## 2018-06-17 DIAGNOSIS — F17210 Nicotine dependence, cigarettes, uncomplicated: Secondary | ICD-10-CM | POA: Insufficient documentation

## 2018-06-17 NOTE — ED Notes (Signed)
Bed: [facility] Expected date:  Expected time:  Means of arrival:  Comments: Fall

## 2018-06-17 NOTE — ED Provider Notes (Signed)
[facility]-EMERGENCY DEPT Provider Note   CSN: [id number] Arrival date & time: 06/17/18  1722    History   Chief Complaint Chief Complaint  Patient presents with  . Addiction Problem    Due to depression.     HPI Douglas Parrish is a 33 y.o. male.     33 year old male presents with decreased level of consciousness.  Patient states he has been up for the past 3 days and used crystal methamphetamine today.  Was found about stop somewhat somnolent.  He denies any suicidal or homicidal ideations.  Denies any other illicit drug use.  No alcohol use.  Was brought here for further management.     Past Medical History:  Diagnosis Date  . Bipolar depression (HCC)   . PTSD (post-traumatic stress disorder)     Patient Active Problem List   Diagnosis Date Noted  . Sepsis (HCC) 04/10/2016  . Hyponatremia 04/10/2016  . Heroin abuse (HCC) 04/10/2016  . Cellulitis of left upper extremity 04/10/2016  . Deep venous thrombosis (HCC) 04/10/2016  . Acute septic pulmonary embolism without acute cor pulmonale (HCC) 04/10/2016  . Abnormal CXR     Past Surgical History:  Procedure Laterality Date  . TEE WITHOUT CARDIOVERSION N/A 04/14/2016   Procedure: TRANSESOPHAGEAL ECHOCARDIOGRAM (TEE);  Surgeon: Chrystie Nose, MD;  Location: [facility] ENDOSCOPY;  Service: Cardiovascular;  Laterality: N/A;        Home Medications    Prior to Admission medications   Medication Sig Start Date End Date Taking? Authorizing Provider  acetaminophen (TYLENOL) 325 MG tablet Take 650 mg by mouth every 6 (six) hours as needed for mild pain.    [provider]  amoxicillin (AMOXIL) 500 MG capsule Take 1 capsule (500 mg total) by mouth 3 (three) times daily. X 14 days 04/15/16   Rai, Delene Ruffini, MD  oxyCODONE-acetaminophen (PERCOCET/ROXICET) 5-325 MG tablet Take 1 tablet by mouth every 4 (four) hours as needed for moderate pain or severe pain. 04/15/16   Rai, Delene Ruffini, MD  promethazine  (PHENERGAN) 12.5 MG tablet Take 1 tablet (12.5 mg total) by mouth every 6 (six) hours as needed for nausea or vomiting. 04/15/16   Cathren Harsh, MD    Family History Family History  Problem Relation Age of Onset  . CAD Neg Hx   . Diabetes Mellitus II Neg Hx     Social History Social History   Tobacco Use  . Smoking status: Current Every Day Smoker    Packs/day: 0.50    Types: Cigarettes  . Smokeless tobacco: Never Used  Substance Use Topics  . Alcohol use: No  . Drug use: Yes    Frequency: 7.0 times per week    Types: IV    Comment: heroin     Allergies   Penicillins   Review of Systems Review of Systems  All other systems reviewed and are negative.    Physical Exam Updated Vital Signs BP 123/75 (BP Location: Left Arm)   Pulse 96   Temp 98.2 F (36.8 C) (Oral)   Resp (!) 22   Ht 1.778 m (5\' 10" )   Wt 72.6 kg   SpO2 98%   BMI 22.96 kg/m   Physical Exam Vitals signs and nursing note reviewed.  Constitutional:      General: He is not in acute distress.    Appearance: Normal appearance. He is well-developed. He is not toxic-appearing.  HENT:     Head: Normocephalic and atraumatic.  Eyes:  General: Lids are normal.     Conjunctiva/sclera: Conjunctivae normal.     Pupils: Pupils are equal, round, and reactive to light.  Neck:     Musculoskeletal: Normal range of motion and neck supple.     Thyroid: No thyroid mass.     Trachea: No tracheal deviation.  Cardiovascular:     Rate and Rhythm: Normal rate and regular rhythm.     Heart sounds: Normal heart sounds. No murmur. No gallop.   Pulmonary:     Effort: Pulmonary effort is normal. No respiratory distress.     Breath sounds: Normal breath sounds. No stridor. No decreased breath sounds, wheezing, rhonchi or rales.  Abdominal:     General: Bowel sounds are normal. There is no distension.     Palpations: Abdomen is soft.     Tenderness: There is no abdominal tenderness. There is no rebound.   Musculoskeletal: Normal range of motion.        General: No tenderness.  Skin:    General: Skin is warm and dry.     Findings: No abrasion or rash.  Neurological:     Mental Status: He is alert and oriented to person, place, and time.     GCS: GCS eye subscore is 4. GCS verbal subscore is 5. GCS motor subscore is 6.     Cranial Nerves: No cranial nerve deficit.     Sensory: No sensory deficit.     Motor: No weakness or tremor.     Gait: Gait normal.  Psychiatric:        Mood and Affect: Affect is flat.        Speech: Speech normal.        Behavior: Behavior is not combative. Behavior is cooperative.        Thought Content: Thought content does not include homicidal or suicidal plan.      ED Treatments / Results  Labs (all labs ordered are listed, but only abnormal results are displayed) Labs Reviewed - No data to display  EKG None  Radiology No results found.  Procedures Procedures (including critical care time)  Medications Ordered in ED Medications - No data to display   Initial Impression / Assessment and Plan / ED Course  I have reviewed the triage vital signs and the nursing notes.  Pertinent labs & imaging results that were available during my care of the patient were reviewed by me and considered in my medical decision making (see chart for details).        Patient without acute psychiatric emergency at this time.  He is ambulatory in the department with a steady gait.  Stable for discharge  Final Clinical Impressions(s) / ED Diagnoses   Final diagnoses:  None    ED Discharge Orders    None       Lorre Nick, MD 06/17/18 [phone]

## 2018-06-17 NOTE — ED Notes (Signed)
Patient stated he's been having depression which has lead to crystal meth use. Patient appears agitated but is cooperative w/ ED staff.

## 2018-06-17 NOTE — ED Triage Notes (Signed)
Patient [place] by EMS from a bus stop. Pt was picked up due to bystanders stating patient was unsteady on his feet.   HR 130  Hx of substance abuse, Hep C.   Pt admitted to injecting crystal meth and smoking marijuana.

## 2018-08-10 ENCOUNTER — Emergency Department (HOSPITAL_COMMUNITY)
Admission: EM | Admit: 2018-08-10 | Discharge: 2018-08-22 | Disposition: E | Payer: Self-pay | Attending: Emergency Medicine | Admitting: Emergency Medicine

## 2018-08-10 DIAGNOSIS — I469 Cardiac arrest, cause unspecified: Secondary | ICD-10-CM | POA: Insufficient documentation

## 2018-08-10 MED ORDER — EPINEPHRINE 1 MG/10ML IJ SOSY
PREFILLED_SYRINGE | INTRAMUSCULAR | Status: AC | PRN
  Administered 2018-08-10 (×2): 1 mg via INTRAVENOUS

## 2018-08-10 MED ORDER — NALOXONE HCL 2 MG/2ML IJ SOSY
PREFILLED_SYRINGE | INTRAMUSCULAR | Status: AC | PRN
  Administered 2018-08-10: 2 mg via INTRAVENOUS

## 2018-08-10 MED ORDER — SODIUM BICARBONATE 8.4 % IV SOLN
INTRAVENOUS | Status: AC | PRN
  Administered 2018-08-10: 50 meq via INTRAVENOUS

## 2018-08-10 MED FILL — Medication: Qty: 1 | Status: AC

## 2018-08-22 NOTE — ED Notes (Signed)
Mellody Dance- Medical Exam called@ 1258-per Dr. Rubin Payor called by Marylene Land

## 2018-08-22 NOTE — ED Notes (Signed)
Pulse check- no pulses, no cardiac activity, asystole on zoll- Time of death 12:33 called by MD pickering
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
## 2018-08-22 NOTE — Code Documentation (Signed)
Pulse check- asystole- cpr restarted pt on lucas at this time

## 2018-08-22 NOTE — Code Documentation (Signed)
Pt intubated by EDP and RT

## 2018-08-22 NOTE — Code Documentation (Signed)
Pt brought into trauma bay receiving CPR and being bagged by triage staff- pt was pulled from a car pulseless.

## 2018-08-22 NOTE — Code Documentation (Signed)
Just updated by family that this patient sleep in car last night was found by friend or family and transported to ED by pov.

## 2018-08-22 DEATH — deceased
